# Patient Record
Sex: Male | Born: 1935 | Race: White | Hispanic: No | Marital: Married | Smoking: Never smoker
Health system: Southern US, Community
[De-identification: ages and names within clinical notes are randomized; demographics above are authoritative.]

## PROBLEM LIST (undated history)

## (undated) DIAGNOSIS — K449 Diaphragmatic hernia without obstruction or gangrene: Secondary | ICD-10-CM

## (undated) DIAGNOSIS — I499 Cardiac arrhythmia, unspecified: Secondary | ICD-10-CM

## (undated) DIAGNOSIS — F0390 Unspecified dementia without behavioral disturbance: Secondary | ICD-10-CM

## (undated) DIAGNOSIS — G459 Transient cerebral ischemic attack, unspecified: Secondary | ICD-10-CM

## (undated) DIAGNOSIS — R413 Other amnesia: Secondary | ICD-10-CM

## (undated) DIAGNOSIS — C787 Secondary malignant neoplasm of liver and intrahepatic bile duct: Principal | ICD-10-CM

## (undated) DIAGNOSIS — E7800 Pure hypercholesterolemia, unspecified: Secondary | ICD-10-CM

## (undated) DIAGNOSIS — K219 Gastro-esophageal reflux disease without esophagitis: Secondary | ICD-10-CM

## (undated) DIAGNOSIS — K5730 Diverticulosis of large intestine without perforation or abscess without bleeding: Secondary | ICD-10-CM

## (undated) HISTORY — PX: ABDOMINAL HERNIA REPAIR: SHX539

## (undated) HISTORY — DX: Transient cerebral ischemic attack, unspecified: G45.9

## (undated) HISTORY — DX: Unspecified dementia, unspecified severity, without behavioral disturbance, psychotic disturbance, mood disturbance, and anxiety: F03.90

## (undated) HISTORY — PX: CHOLECYSTECTOMY: SHX55

## (undated) HISTORY — DX: Other amnesia: R41.3

## (undated) HISTORY — DX: Diverticulosis of large intestine without perforation or abscess without bleeding: K57.30

## (undated) HISTORY — DX: Secondary malignant neoplasm of liver and intrahepatic bile duct: C78.7

## (undated) HISTORY — DX: Diaphragmatic hernia without obstruction or gangrene: K44.9

## (undated) HISTORY — DX: Gastro-esophageal reflux disease without esophagitis: K21.9

## (undated) HISTORY — DX: Pure hypercholesterolemia, unspecified: E78.00

---

## 1999-11-08 ENCOUNTER — Encounter (INDEPENDENT_AMBULATORY_CARE_PROVIDER_SITE_OTHER): Payer: Self-pay | Admitting: *Deleted

## 1999-11-08 ENCOUNTER — Ambulatory Visit (HOSPITAL_BASED_OUTPATIENT_CLINIC_OR_DEPARTMENT_OTHER): Admission: RE | Admit: 1999-11-08 | Discharge: 1999-11-08 | Payer: Self-pay | Admitting: *Deleted

## 1999-12-29 ENCOUNTER — Other Ambulatory Visit: Admission: RE | Admit: 1999-12-29 | Discharge: 1999-12-29 | Payer: Self-pay | Admitting: Urology

## 2002-04-30 DIAGNOSIS — K5730 Diverticulosis of large intestine without perforation or abscess without bleeding: Secondary | ICD-10-CM

## 2002-04-30 HISTORY — DX: Diverticulosis of large intestine without perforation or abscess without bleeding: K57.30

## 2002-08-19 ENCOUNTER — Encounter: Payer: Self-pay | Admitting: Internal Medicine

## 2002-08-19 ENCOUNTER — Encounter: Admission: RE | Admit: 2002-08-19 | Discharge: 2002-08-19 | Payer: Self-pay | Admitting: Internal Medicine

## 2002-10-31 DIAGNOSIS — K449 Diaphragmatic hernia without obstruction or gangrene: Secondary | ICD-10-CM

## 2002-10-31 DIAGNOSIS — K219 Gastro-esophageal reflux disease without esophagitis: Secondary | ICD-10-CM

## 2002-10-31 HISTORY — DX: Diaphragmatic hernia without obstruction or gangrene: K44.9

## 2002-10-31 HISTORY — DX: Gastro-esophageal reflux disease without esophagitis: K21.9

## 2004-10-11 ENCOUNTER — Ambulatory Visit: Payer: Self-pay | Admitting: Gastroenterology

## 2005-04-07 ENCOUNTER — Encounter: Admission: RE | Admit: 2005-04-07 | Discharge: 2005-04-07 | Payer: Self-pay | Admitting: Internal Medicine

## 2006-09-27 ENCOUNTER — Encounter: Admission: RE | Admit: 2006-09-27 | Discharge: 2006-09-27 | Payer: Self-pay | Admitting: Internal Medicine

## 2006-11-09 ENCOUNTER — Ambulatory Visit (HOSPITAL_COMMUNITY): Admission: RE | Admit: 2006-11-09 | Discharge: 2006-11-09 | Payer: Self-pay | Admitting: General Surgery

## 2010-10-25 NOTE — Op Note (Signed)
NAME:  Randall Francis, Randall Francis NO.:  [id number]   MEDICAL RECORD NO.:  [id number]          PATIENT TYPE:  AMB   LOCATION:  DAY                          FACILITY:  [facility]   PHYSICIAN:  Adolph Pollack, M.D.DATE OF BIRTH:  January 17, 1923   DATE OF PROCEDURE:  11/09/2006  DATE OF DISCHARGE:                               OPERATIVE REPORT   PROCEDURE:  Left inguinal hernia.   POSTOPERATIVE DIAGNOSIS:  Left inguinal hernia (indirect).   PROCEDURE:  Left inguinal hernia repair with mesh.   SURGEON:  Adolph Pollack, M.D.   ANESTHESIA:  General by way of LMA plus local (0.5% plain Marcaine).   INDICATIONS:  Randall Francis was a very active 77 year old male who had  noted a painful swelling a left inguinal area and described an episode  of transient incarceration and where he is actually able to lie down and  push the hernia back in when it came out and was painful.  He has a left  inguinal hernia on exam and now presents for repair.  We have discussed  the procedure risks and aftercare preoperatively.   TECHNIQUE:  He was seen in the holding area and the left groin marked  with my initials.  He was then brought to the operating room, placed  supine on the operating table, and given a general anesthetic by way of  LMA.  The hair in the left groin was clipped and the area was sterilely  prepped and draped.  Local anesthetic was infiltrated superficially and  deep in the left groin area.  A left groin incision was made through the  skin and subcutaneous tissue until the external oblique aponeurosis was  identified.  More local anesthetic was infiltrated deep to the external  oblique aponeurosis.  The external oblique aponeurosis was then divided  through the external ring medially up toward the anterior superior iliac  spine laterally.  Using blunt dissection, the shelving edge of the  inguinal ligament was exposed inferiorly and the internal oblique muscle  and aponeurosis  were exposed superiorly.  The ilioinguinal nerve was  identified and retracted inferiorly out of the field of dissection.   I then encircled the spermatic cord and isolated it doing posterior  dissection.  The indirect hernia sac was identified and was it carefully  dissected free from the spermatic cord contents and reduced back through  a patulous internal ring.   A piece of 3 x 6 inch polypropylene mesh was brought into the field and  anchored 1-2 cm medial to the pubic tubercle with 2-0 Prolene suture.  The inferior aspect of the mesh was anchored to the shelving edge of the  inguinal ligament with running 2-0 Prolene suture up to a level 1-2 cm  lateral to the internal ring.  A slit was cut in the mesh and the two  tails were wrapped around the cord.  The superior aspect of the mesh was  anchored to the internal oblique aponeurosis and muscle with interrupted  2-0 Vicryl sutures.  The two tails of the mesh were crossed creating a  new internal ring, and these were anchored  to the shelving edge of the  inguinal ligament with a single 2-0 Prolene suture.  The tip of a  hemostat was able to be placed through the new aperture.   Following this I inspected the area and hemostasis was adequate.  I  injected local anesthetic into the internal oblique aponeurosis.  I then  replaced the ilioinguinal nerve and closed the external oblique  aponeurosis over the mesh and  cord with a running 3-0 Vicryl suture.  Scarpa fascia was approximated  with a running 2-0 Vicryl suture.  The skin was closed with 4-0 Monocryl  subcuticular stitches followed by Steri-Strips and sterile dressings.  He tolerated the procedure without any apparent complications and was  taken to the recovery room in satisfactory condition.      Adolph Pollack, M.D.  Electronically Signed     TJR/MEDQ  D:  11/09/2006  T:  11/09/2006  Job:  [id number]   cc:   Loraine Leriche A. Perini, M.D.  Fax: [phone]

## 2012-08-30 ENCOUNTER — Other Ambulatory Visit: Payer: Self-pay | Admitting: Neurology

## 2012-10-23 ENCOUNTER — Encounter: Payer: Self-pay | Admitting: Nurse Practitioner

## 2012-10-23 ENCOUNTER — Ambulatory Visit (INDEPENDENT_AMBULATORY_CARE_PROVIDER_SITE_OTHER): Payer: Medicare Other | Admitting: Nurse Practitioner

## 2012-10-23 VITALS — BP 118/70 | HR 89 | Ht 72.5 in | Wt 180.0 lb

## 2012-10-23 DIAGNOSIS — R413 Other amnesia: Secondary | ICD-10-CM | POA: Insufficient documentation

## 2012-10-23 MED ORDER — RIVASTIGMINE 9.5 MG/24HR TD PT24: 1.0000 | MEDICATED_PATCH | Freq: Every day | TRANSDERMAL | Status: DC

## 2012-10-23 NOTE — Progress Notes (Signed)
HPI: The patient is a 79 y.o. year old male who has had memory issues for about six years.  The patient does his finances in the home but his wife  writes the checks. The patient  does  drive.  There have not been any motor vehicle accidents.  The patient does not cook. The patient is  able to perform  own ADL's.  The patient is able to distribute  own medications.  The patients bladder and bowel are  under good control.  There have been no behavioral changes since last seen.  There have been  no hallucinations. He is currently on Exelon patch without skin issues. He has had side effects to Namenda and Aricept in the past. He plays golf several times a week. Occasionally goes for walks. Weight is stable No new neurologic complaints   ROS:   weight loss, fatigue, memory loss, change in appetite   Physical Exam General: well developed, well nourished, seated, in no evident distress Head: head normocephalic and atraumatic. Oropharynx benign Neck: supple with no carotid or supraclavicular bruits Cardiovascular: regular rate and rhythm, no murmurs  Neurologic Exam Mental Status: Awake and fully alert. Oriented to place and time. MMSE 29/30 missing one recall item . AFT 12.  Mood and affect appropriate.  Cranial Nerves:Pupils equal, briskly reactive to light. Extraocular movements full without nystagmus. Visual fields full to confrontation. Hearing decreased bilaterally with hearing aids . Facial sensation intact. Face, tongue, palate move normally and symmetrically. Neck flexion and extension normal.  Motor: Normal bulk and tone. Normal strength in all tested extremity muscles. No focal weakness Sensory.: intact to touch and pinprick and decreased vibratory in both feet and ankles .  Coordination: Rapid alternating movements normal in all extremities. Finger-to-nose and heel-to-shin performed accurately bilaterally. Gait and Station: Arises from chair without difficulty. Stance is normal. . Able to heel,  toe and tandem walk without difficulty. No assistive device Reflexes: 1+ and symmetric. Toes downgoing.     ASSESSMENT: Mild memory loss with stable MMSE 29/30,  Missing one recall item. AFT 12.      PLAN: Patient to  continue Exelon patches, will refill Fall risk =7, be careful with ambulation Followup in one year, next visit with Dr. Leander Rams Darrol Angel, GNP-BC APRN

## 2012-10-23 NOTE — Patient Instructions (Addendum)
Patient to  continue Exelon patches Followup in one year, next visit with Dr. Vickey Huger

## 2013-01-14 ENCOUNTER — Ambulatory Visit: Payer: Medicare Other | Attending: Internal Medicine | Admitting: Physical Therapy

## 2013-01-14 DIAGNOSIS — IMO0001 Reserved for inherently not codable concepts without codable children: Secondary | ICD-10-CM | POA: Insufficient documentation

## 2013-01-14 DIAGNOSIS — R269 Unspecified abnormalities of gait and mobility: Secondary | ICD-10-CM | POA: Insufficient documentation

## 2013-01-14 DIAGNOSIS — R42 Dizziness and giddiness: Secondary | ICD-10-CM | POA: Insufficient documentation

## 2013-01-21 ENCOUNTER — Ambulatory Visit: Payer: Medicare Other | Admitting: Physical Therapy

## 2013-01-27 ENCOUNTER — Ambulatory Visit: Payer: Medicare Other | Admitting: Physical Therapy

## 2013-02-03 ENCOUNTER — Ambulatory Visit: Payer: Medicare Other | Admitting: Physical Therapy

## 2013-02-11 ENCOUNTER — Ambulatory Visit: Payer: Medicare Other | Attending: Internal Medicine | Admitting: Physical Therapy

## 2013-02-11 DIAGNOSIS — IMO0001 Reserved for inherently not codable concepts without codable children: Secondary | ICD-10-CM | POA: Insufficient documentation

## 2013-02-11 DIAGNOSIS — R42 Dizziness and giddiness: Secondary | ICD-10-CM | POA: Insufficient documentation

## 2013-02-11 DIAGNOSIS — R269 Unspecified abnormalities of gait and mobility: Secondary | ICD-10-CM | POA: Insufficient documentation

## 2013-04-30 ENCOUNTER — Ambulatory Visit (INDEPENDENT_AMBULATORY_CARE_PROVIDER_SITE_OTHER): Payer: Medicare Other | Admitting: Podiatrist

## 2013-04-30 ENCOUNTER — Encounter: Payer: Self-pay | Admitting: Podiatrist

## 2013-04-30 VITALS — BP 126/77 | HR 76 | Resp 12 | Ht 73.0 in | Wt 194.0 lb

## 2013-04-30 DIAGNOSIS — B351 Tinea unguium: Secondary | ICD-10-CM

## 2013-04-30 DIAGNOSIS — M79609 Pain in unspecified limb: Secondary | ICD-10-CM

## 2013-05-02 NOTE — Progress Notes (Signed)
HPI:  Patient presents today for follow up of foot and nail care. Denies any new complaints today.  Objective:  Patients chart is reviewed.  Neurovascular status unchanged.  Patients nails are thickened, discolored, distrophic, friable and brittle with yellow-brown discoloration. Patient subjectively relates they are painful with shoes and with ambulation of bilateral feet.  Assessment:  Symptomatic onychomycosis  Plan:  Discussed treatment options and alternatives.  The symptomatic toenails were debrided through manual an mechanical means without complication.  Return appointment recommended at routine intervals of 3 months    Midge Momon, DPM   

## 2013-05-30 ENCOUNTER — Other Ambulatory Visit: Payer: Self-pay | Admitting: Internal Medicine

## 2013-05-30 ENCOUNTER — Ambulatory Visit
Admission: RE | Admit: 2013-05-30 | Discharge: 2013-05-30 | Disposition: A | Discharge status: Home / Self Care | Payer: Medicare Other | Source: Ambulatory Visit | Attending: Internal Medicine | Admitting: Internal Medicine

## 2013-05-30 ENCOUNTER — Encounter: Payer: Self-pay | Admitting: Gastroenterology

## 2013-05-30 DIAGNOSIS — R634 Abnormal weight loss: Secondary | ICD-10-CM

## 2013-05-30 DIAGNOSIS — R945 Abnormal results of liver function studies: Secondary | ICD-10-CM

## 2013-05-30 MED ORDER — IOHEXOL 300 MG/ML  SOLN
30.0000 mL | Freq: Once | INTRAMUSCULAR | Status: AC | PRN
  Administered 2013-05-30: 30 mL via ORAL

## 2013-05-30 MED ORDER — IOHEXOL 300 MG/ML  SOLN
100.0000 mL | Freq: Once | INTRAMUSCULAR | Status: AC | PRN
  Administered 2013-05-30: 100 mL via INTRAVENOUS

## 2013-06-02 ENCOUNTER — Encounter: Payer: Self-pay | Admitting: *Deleted

## 2013-06-02 ENCOUNTER — Telehealth: Payer: Self-pay | Admitting: Oncology

## 2013-06-02 ENCOUNTER — Other Ambulatory Visit (HOSPITAL_COMMUNITY): Payer: Self-pay | Admitting: Internal Medicine

## 2013-06-02 DIAGNOSIS — K769 Liver disease, unspecified: Secondary | ICD-10-CM

## 2013-06-02 NOTE — Telephone Encounter (Signed)
S/W PT WIFE AND GVE NP APPT 12/24 @ 9:30 W/LISA Maisie Fus PER MD

## 2013-06-03 ENCOUNTER — Ambulatory Visit (INDEPENDENT_AMBULATORY_CARE_PROVIDER_SITE_OTHER): Payer: Medicare Other | Admitting: Gastroenterology

## 2013-06-03 ENCOUNTER — Encounter (HOSPITAL_COMMUNITY): Payer: Self-pay | Admitting: Pharmacy Technician

## 2013-06-03 ENCOUNTER — Other Ambulatory Visit: Payer: Self-pay | Admitting: Radiology

## 2013-06-03 ENCOUNTER — Encounter: Payer: Self-pay | Admitting: Gastroenterology

## 2013-06-03 VITALS — BP 84/60 | HR 72 | Ht 72.0 in | Wt 166.0 lb

## 2013-06-03 DIAGNOSIS — C799 Secondary malignant neoplasm of unspecified site: Secondary | ICD-10-CM

## 2013-06-03 DIAGNOSIS — R160 Hepatomegaly, not elsewhere classified: Secondary | ICD-10-CM

## 2013-06-03 DIAGNOSIS — R634 Abnormal weight loss: Secondary | ICD-10-CM

## 2013-06-03 DIAGNOSIS — C801 Malignant (primary) neoplasm, unspecified: Secondary | ICD-10-CM

## 2013-06-03 NOTE — Patient Instructions (Signed)
You will be contacted by [facility] regarding your Cologuard test.

## 2013-06-03 NOTE — Progress Notes (Signed)
History of Present Illness:  This is a 79 year old Caucasian male with newly diagnosed probable metastatic cancer to his liver per CT scan performed on 05/30/2013.  This shows diffuse hepatic metastases without primary neoplasm, also metastasis to S2 sacral segment, and some retroperitoneal lymph nodes.  There is a 1.4 x 2.3 cm right renal lesion which is been present for some time.  Patient's main complaint is fatigue and lack of appetite.  Otherwise he denies any general or specific GI complaint.  I did colonoscopy in 2003 and endoscopy exam which were unremarkable.  There is no evidence of colon polyps, Barrett's mucosa, or other lesions.  Patient has appointment with oncology later this week.  He has no history of alcohol abuse, cirrhosis, or any previous liver disease.  He is gradually losing weight, has advancing fatigue, but denies fever, chills, or other systemic complaints.  Labs show mild anemia with hematocrit of 36 and normal liver function tests except for slightly elevated bilirubin, he has a history of enlarged prostate is followed by Dr. Gaynelle Arabian.  Recent PSA was normal at 1.8 despite enlarged prostate on CT scan.  Serum creatinine was normal at 1.2.  Hematocrit was 36 with an elevated MCV of 102, certainly not consistent with iron deficiency.  His family history is negative for any known gastrointestinal cancer.  Previous CT scan was done in September of 2013 in April of 2014.  These exams did not show any specific abnormality other than a duodenal diverticulum, 0.9 cm left lung nodule, exophytic right kidney lesion, and enlarged prostate.  I have reviewed this patient's present history, medical and surgical past history, allergies and medications.     ROS:   All systems were reviewed and are negative unless otherwise stated in the HPI.  Allergies  Allergen Reactions  . Aricept [Donepezil Hcl]   . Statins    Outpatient Prescriptions Prior to Visit  Medication Sig Dispense Refill  .  b complex vitamins tablet Take 1 tablet by mouth daily.      . finasteride (PROSCAR) 5 MG tablet Take 5 mg by mouth daily.       . fluticasone (FLONASE) 50 MCG/ACT nasal spray       . omega-3 acid ethyl esters (LOVAZA) 1 G capsule Take 1 mg by mouth 2 (two) times daily.       . valsartan (DIOVAN) 160 MG tablet Take 160 mg by mouth daily.      Marland Kitchen zolpidem (AMBIEN) 10 MG tablet Take .25 tab a day      . telmisartan (MICARDIS) 40 MG tablet        No facility-administered medications prior to visit.   Past Medical History  Diagnosis Date  . High cholesterol   . Dementia   . Memory loss   . TIA (transient ischemic attack)   . Diverticulosis of colon (without mention of hemorrhage) 04-30-2002  . Hiatal hernia 10-31-2002  . GERD (gastroesophageal reflux disease) 10-31-2002   Past Surgical History  Procedure Laterality Date  . Abdominal hernia repair    . Cholecystectomy     History   Social History  . Marital Status: Married    Spouse Name: N/A    Number of Children: 4  . Years of Education: N/A   Occupational History  . retired    Social History Main Topics  . Smoking status: Never Smoker   . Smokeless tobacco: Never Used  . Alcohol Use: No  . Drug Use: No  . Sexual Activity:  None   Other Topics Concern  . None   Social History Narrative   Patient is married and has 4 children. Pt is retired and worked in [employer] for 50 plus years.    Family History  Problem Relation Age of Onset  . Cancer Mother     male cancer ? type  . Hypertension Father        Physical Exam: Blood pressure 84/60, pulse 72 and regular and weight 166 with a BMI of 22.51.  This patient has no obvious stigmata of chronic liver disease.  His abdomen shows an enlarged lobular liver in the right upper quadrant without other abdominal masses or evidence of ascites.  General physical exam otherwise was not performed.  Mental status is normal.    Assessment and plan: Randall Francis  unfortunately has metastatic cancer to his liver, primary source unclear.  I suspect he may have renal cell carcinoma which is metastatic to his liver and bones.  I have ordered a COLOGUARD stool exam which is a DNA test for colon cancer, and is 97% specific.  I do not think he would benefit from repeat endoscopy or colonoscopy at this time.  Current plans are to apparently to see oncology and undergo CT-guided liver biopsy with possible chemotherapy.  I concur with these plans.  Unfortunately we have very little last had this patient's care at this point

## 2013-06-04 ENCOUNTER — Ambulatory Visit: Payer: Medicare Other

## 2013-06-04 ENCOUNTER — Telehealth: Payer: Self-pay | Admitting: Oncology

## 2013-06-04 ENCOUNTER — Ambulatory Visit (HOSPITAL_BASED_OUTPATIENT_CLINIC_OR_DEPARTMENT_OTHER): Payer: Medicare Other | Admitting: Nurse Practitioner

## 2013-06-04 ENCOUNTER — Ambulatory Visit (HOSPITAL_COMMUNITY)
Admission: RE | Admit: 2013-06-04 | Discharge: 2013-06-04 | Disposition: A | Discharge status: Home / Self Care | Payer: Medicare Other | Source: Ambulatory Visit | Attending: Nurse Practitioner | Admitting: Nurse Practitioner

## 2013-06-04 VITALS — BP 109/69 | HR 71 | Temp 97.4°F | Resp 19 | Ht 72.0 in | Wt 165.5 lb

## 2013-06-04 DIAGNOSIS — R05 Cough: Secondary | ICD-10-CM | POA: Insufficient documentation

## 2013-06-04 DIAGNOSIS — R059 Cough, unspecified: Secondary | ICD-10-CM | POA: Insufficient documentation

## 2013-06-04 DIAGNOSIS — R630 Anorexia: Secondary | ICD-10-CM

## 2013-06-04 DIAGNOSIS — I2510 Atherosclerotic heart disease of native coronary artery without angina pectoris: Secondary | ICD-10-CM | POA: Insufficient documentation

## 2013-06-04 DIAGNOSIS — I959 Hypotension, unspecified: Secondary | ICD-10-CM

## 2013-06-04 DIAGNOSIS — M216X9 Other acquired deformities of unspecified foot: Secondary | ICD-10-CM

## 2013-06-04 DIAGNOSIS — C801 Malignant (primary) neoplasm, unspecified: Secondary | ICD-10-CM | POA: Insufficient documentation

## 2013-06-04 DIAGNOSIS — C787 Secondary malignant neoplasm of liver and intrahepatic bile duct: Secondary | ICD-10-CM

## 2013-06-04 DIAGNOSIS — R634 Abnormal weight loss: Secondary | ICD-10-CM

## 2013-06-04 DIAGNOSIS — I700 Atherosclerosis of aorta: Secondary | ICD-10-CM | POA: Insufficient documentation

## 2013-06-04 DIAGNOSIS — K7689 Other specified diseases of liver: Secondary | ICD-10-CM

## 2013-06-04 DIAGNOSIS — I517 Cardiomegaly: Secondary | ICD-10-CM | POA: Insufficient documentation

## 2013-06-04 NOTE — Telephone Encounter (Signed)
Gave pt appt for MD visit on 12/29th, pt sent to CT today , pno precert needed per [facility]

## 2013-06-04 NOTE — Progress Notes (Addendum)
OFFICE PROGRESS NOTE   Reason for Referral: Probable liver metastases on CT.   HPI: [name] is an 79 year old man referred to the cancer Center for evaluation of probable liver metastases identified on CT. He recently presented to his primary provider with anorexia, weight loss and fatigue. Labs showed abnormal liver function tests. He was referred for a CT scan of the abdomen and pelvis on 05/30/2013 with findings of diffuse hypodense lesions throughout the liver compatible with metastatic disease. An index lesion within the lower right liver measured 4 x 5 cm. There were a few enlarged gastrohepatic ligament, periportal and retroperitoneal lymph nodes with the index lymph node measuring 1.5 x 2.4 cm. There was a 1.4 x 2.3 cm right renal lesion, indeterminate. The pancreas, adrenal glands and spleen were unremarkable. Colonic diverticulosis noted with no other definite bowel abnormalities noted. A lytic lesion was noted within the posterior aspect of the S2 sacral segment.   He is scheduled for biopsy of a liver lesion on 06/06/2013.   He was seen by Dr. Jarold Motto, gastroenterology, on 06/03/2013. Repeat endoscopy or colonoscopy not felt to be of benefit at this time.     Past medical history: 1. Hypertension  2. Question history of TIA.  3. Memory issues.   Past surgical history: 1. Recent cataract surgery.  2. Remote cholecystectomy.  3. Left inguinal hernia repair with mesh 11/09/2006.  4. Status post removal of multiple "precancerous" skin lesions.   Current outpatient prescriptions:acetaminophen (TYLENOL) 325 MG tablet, Take 650 mg by mouth daily as needed for mild pain., Disp: , Rfl: ;  aspirin 81 MG tablet, Take 81 mg by mouth daily., Disp: , Rfl: ;  b complex vitamins tablet, Take 1 tablet by mouth daily., Disp: , Rfl: ;  Calcium Citrate-Vitamin D (CALCIUM CITRATE + D PO), Take 1 tablet by mouth 2 (two) times daily., Disp: , Rfl: ;  azithromycin (ZITHROMAX) 500 MG tablet, ,  Disp: , Rfl:  finasteride (PROSCAR) 5 MG tablet, Take 5 mg by mouth daily. , Disp: , Rfl: ;  fluticasone (FLONASE) 50 MCG/ACT nasal spray, , Disp: , Rfl: ;  folic acid (FOLVITE) 1 MG tablet, Take 1 mg by mouth 2 (two) times daily., Disp: , Rfl: ;  ibuprofen (ADVIL,MOTRIN) 200 MG tablet, Take 200 mg by mouth 2 (two) times daily as needed for mild pain., Disp: , Rfl:  omega-3 acid ethyl esters (LOVAZA) 1 G capsule, Take 1 mg by mouth 2 (two) times daily. , Disp: , Rfl: ;  OVER THE COUNTER MEDICATION, Take 1 tablet by mouth 2 (two) times daily. T2 Bone Health, Disp: , Rfl: ;  Simethicone (GAS-X PO), Take 1 tablet by mouth daily., Disp: , Rfl: ;  valsartan (DIOVAN) 160 MG tablet, Take 160 mg by mouth daily., Disp: , Rfl: ;  zolpidem (AMBIEN) 10 MG tablet, Take .25 tab a day, Disp: , Rfl: :  Allergies  Allergen Reactions  . Aricept [Donepezil Hcl]   . Statins   :  Family History: Mother deceased at age 69 with a gynecologic cancer; father deceased at age 40 of "old age". Sister deceased with "bladder or kidney cancer"; sister deceased in an airplane accident. One of his daughters has a history of aplastic anemia and MDS and underwent a bone marrow transplant.   Social History: He lives in [place]. He is married. He is retired. He previously worked as a [employer] manager. No alcohol or tobacco use. He has 4 children, 2 sons and 2 daughters.  Review of systems: 2-3 week history of significant anorexia with resultant weight loss. He is fatigued. No unusual headaches. No diplopia. He denies shortness of breath. He has been coughing for the past several days with phlegm expectorated. No chest pain. He has pain at the right upper quadrant with coughing. Recently he has had less frequent bowel movements. He attributes this to poor oral intake. No bloody or black stools. No nausea or vomiting. He denies dysphagia. No hematuria or dysuria. He notes that his urine is a dark yellow color. He estimates getting  up 3 times a night to urinate. No back pain.    Physcial Exam:  Blood pressure 78/54, pulse 90, temperature 97.4 F (36.3 C), temperature source Oral, resp. rate 19, height 6' (1.829 m), weight 165 lb 8 oz (75.07 kg).  General: Frail-appearing elderly male in no acute distress. HEENT: Pupils equal round and reactive to light. Extraocular movements intact. Mild scleral icterus. Oropharynx is without thrush or ulceration. Mucous membranes appear somewhat dry. Chest: Lungs are clear. No wheezes or rales. CV: Heart sounds distant. Regular cardiac rhythm. Abdomen: Abdomen soft and nontender. Liver is enlarged in the right abdomen to nearly the level of the umbilicus and crosses midline into the left upper quadrant. Extremities: No leg edema. Calves soft and nontender. Neuro: Alert and oriented. Motor strength intact except slight weakness with dorsiflexion at the right foot. Knee DTRs 1+, symmetric. Finger to nose intact. Hard of hearing. Skin: Decreased skin turgor. Areas of sun damaged noted over the face, ears.    Studies/Results:  Ct Abdomen Pelvis W Contrast  05/30/2013   CLINICAL DATA:  79 year old male with weight loss and abnormal LFTs.  EXAM: CT ABDOMEN AND PELVIS WITH CONTRAST  TECHNIQUE: Multidetector CT imaging of the abdomen and pelvis was performed using the standard protocol following bolus administration of intravenous contrast.  CONTRAST:  100 cc intravenous Omnipaque 300  COMPARISON:  None.  FINDINGS: Cardiomegaly is noted.  Diffuse hypodense lesions throughout the liver are noted compatible with metastatic disease. An index lesion within the lower right liver measures 4 x 5 cm (image 43).  There are a few enlarged gastrohepatic ligament periportal and retroperitoneal lymph nodes present, within the index lymph node measuring 1.5 x 2.4 cm (image 29).  Bilateral renal cortical atrophy noted. A 1.4 x 2.3 cm right renal lesion (84 Hounsfield units) is indeterminate.  The pancreas,  adrenal glands and spleen are unremarkable.  There is no evidence of biliary dilatation, abdominal aortic aneurysm or free fluid.  Colonic diverticulosis noted without evidence of diverticulitis. No other definite bowel abnormalities are noted.  A lytic lesion within the posterior aspect of the S2 sacral segment is noted.  Degenerative changes within the hips and lumbar spine noted.  IMPRESSION: Diffuse hepatic metastases without identifiable primary neoplasm. Probable metastasis within the S2 sacral segment and abdominal/retroperitoneal lymphatic spread.  1.4 x 2.3 cm right renal lesion - indeterminate. This may represent a metastatic lesion, primary neoplasm (unlikely to be the cause of the liver metastases) or complicated cyst.  These results were called by telephone at the time of interpretation on 05/30/2013 at 7:08 PM to Dr. Kevan Ny, who verbally acknowledged these results.   Electronically Signed   By: Laveda Abbe M.D.   On: 05/30/2013 19:09     Assessment and Plan:   1. CT abdomen/pelvis 05/30/2013 with diffuse hypodense lesions throughout the liver; a few enlarged gastrohepatic ligament, periportal and retroperitoneal lymph nodes; 1.4 x 2.3 cm right renal lesion,  indeterminate; lytic lesion within the posterior aspect of the S2 sacral segment. 2. Anorexia, weight loss, fatigue secondary to liver metastases. 3. Hypotension. Question dehydration. 4. Mild right foot drop. Question due to peroneal nerve compression related to weight loss.  Disposition-Mr. Docter appears to have metastatic cancer involving the liver. The primary site is unclear by history and evaluation to date. The right kidney lesion may be a cyst or primary renal cell carcinoma. However, the pattern of spread would be unusual for metastatic kidney cancer.  We are referring him for a noncontrast CT scan of the chest to complete the staging evaluation. He is scheduled for a liver biopsy on 06/06/2013.  He was hypotensive upon  arrival to the office. His blood pressure improved significantly with oral hydration while in the office. He will continue to push fluids at home.  He will return for a followup visit on 06/09/2013. He and his family understand to contact the office in the interim with any problems.    Patient seen with Dr. Truett Perna.    Lonna Cobb, ANP/GNP-BC  Mr. Zaremba was interviewed and examined. I reviewed the CT images and discussed the differential diagnosis with Mr. Bircher and his family. He appears to have extensive metastatic disease involving the liver. No clear  Primary tumor site based on review of his history, exam, and the abdomen/pelvic CT.  He will undergo a liver biopsy on 06/06/2013. He will return for an office visit on 06/09/2013.  Mancel Bale, M.D.

## 2013-06-05 ENCOUNTER — Other Ambulatory Visit: Payer: Self-pay | Admitting: Radiology

## 2013-06-06 ENCOUNTER — Ambulatory Visit (HOSPITAL_COMMUNITY)
Admission: RE | Admit: 2013-06-06 | Discharge: 2013-06-06 | Disposition: A | Discharge status: Home / Self Care | Payer: Medicare Other | Source: Ambulatory Visit | Attending: Internal Medicine | Admitting: Internal Medicine

## 2013-06-06 ENCOUNTER — Other Ambulatory Visit: Payer: Self-pay

## 2013-06-06 ENCOUNTER — Encounter (HOSPITAL_COMMUNITY): Payer: Self-pay

## 2013-06-06 ENCOUNTER — Encounter (HOSPITAL_COMMUNITY): Payer: Self-pay | Admitting: Emergency Medicine

## 2013-06-06 ENCOUNTER — Observation Stay (HOSPITAL_COMMUNITY)
Admission: EM | Admit: 2013-06-06 | Discharge: 2013-06-08 | Disposition: A | Discharge status: Home / Self Care | Payer: Medicare Other | Attending: Family Medicine | Admitting: Family Medicine

## 2013-06-06 DIAGNOSIS — I499 Cardiac arrhythmia, unspecified: Principal | ICD-10-CM | POA: Diagnosis present

## 2013-06-06 DIAGNOSIS — J209 Acute bronchitis, unspecified: Secondary | ICD-10-CM | POA: Diagnosis present

## 2013-06-06 DIAGNOSIS — I493 Ventricular premature depolarization: Secondary | ICD-10-CM

## 2013-06-06 DIAGNOSIS — R630 Anorexia: Secondary | ICD-10-CM | POA: Insufficient documentation

## 2013-06-06 DIAGNOSIS — K769 Liver disease, unspecified: Secondary | ICD-10-CM

## 2013-06-06 DIAGNOSIS — K219 Gastro-esophageal reflux disease without esophagitis: Secondary | ICD-10-CM | POA: Diagnosis present

## 2013-06-06 DIAGNOSIS — Z888 Allergy status to other drugs, medicaments and biological substances status: Secondary | ICD-10-CM | POA: Insufficient documentation

## 2013-06-06 DIAGNOSIS — F0390 Unspecified dementia without behavioral disturbance: Secondary | ICD-10-CM | POA: Insufficient documentation

## 2013-06-06 DIAGNOSIS — Z8673 Personal history of transient ischemic attack (TIA), and cerebral infarction without residual deficits: Secondary | ICD-10-CM | POA: Insufficient documentation

## 2013-06-06 DIAGNOSIS — Z79899 Other long term (current) drug therapy: Secondary | ICD-10-CM | POA: Insufficient documentation

## 2013-06-06 DIAGNOSIS — R7401 Elevation of levels of liver transaminase levels: Secondary | ICD-10-CM | POA: Diagnosis present

## 2013-06-06 DIAGNOSIS — E871 Hypo-osmolality and hyponatremia: Secondary | ICD-10-CM | POA: Diagnosis present

## 2013-06-06 DIAGNOSIS — E860 Dehydration: Secondary | ICD-10-CM | POA: Diagnosis present

## 2013-06-06 DIAGNOSIS — R413 Other amnesia: Secondary | ICD-10-CM | POA: Diagnosis present

## 2013-06-06 DIAGNOSIS — I4949 Other premature depolarization: Secondary | ICD-10-CM

## 2013-06-06 DIAGNOSIS — E43 Unspecified severe protein-calorie malnutrition: Secondary | ICD-10-CM | POA: Diagnosis present

## 2013-06-06 DIAGNOSIS — C787 Secondary malignant neoplasm of liver and intrahepatic bile duct: Secondary | ICD-10-CM | POA: Diagnosis present

## 2013-06-06 DIAGNOSIS — IMO0002 Reserved for concepts with insufficient information to code with codable children: Secondary | ICD-10-CM | POA: Insufficient documentation

## 2013-06-06 DIAGNOSIS — C801 Malignant (primary) neoplasm, unspecified: Secondary | ICD-10-CM

## 2013-06-06 DIAGNOSIS — E7800 Pure hypercholesterolemia, unspecified: Secondary | ICD-10-CM | POA: Insufficient documentation

## 2013-06-06 DIAGNOSIS — Z7982 Long term (current) use of aspirin: Secondary | ICD-10-CM | POA: Insufficient documentation

## 2013-06-06 HISTORY — DX: Cardiac arrhythmia, unspecified: I49.9

## 2013-06-06 LAB — CBC WITH DIFFERENTIAL/PLATELET
Basophils Absolute: 0 10*3/uL (ref 0.0–0.1)
Basophils Relative: 0 % (ref 0–1)
Eosinophils Absolute: 0 10*3/uL (ref 0.0–0.7)
HCT: 34.2 % — ABNORMAL LOW (ref 39.0–52.0)
MCH: 34.3 pg — ABNORMAL HIGH (ref 26.0–34.0)
MCHC: 36.5 g/dL — ABNORMAL HIGH (ref 30.0–36.0)
MCV: 94 fL (ref 78.0–100.0)
Neutrophils Relative %: 87 % — ABNORMAL HIGH (ref 43–77)
Platelets: 286 10*3/uL (ref 150–400)
RBC: 3.64 MIL/uL — ABNORMAL LOW (ref 4.22–5.81)
RDW: 14.7 % (ref 11.5–15.5)

## 2013-06-06 LAB — PROTIME-INR
INR: 1.16 (ref 0.00–1.49)
Prothrombin Time: 14.6 seconds (ref 11.6–15.2)

## 2013-06-06 LAB — COMPREHENSIVE METABOLIC PANEL
ALT: 148 U/L — ABNORMAL HIGH (ref 0–53)
AST: 268 U/L — ABNORMAL HIGH (ref 0–37)
Albumin: 2.8 g/dL — ABNORMAL LOW (ref 3.5–5.2)
CO2: 26 mEq/L (ref 19–32)
Chloride: 89 mEq/L — ABNORMAL LOW (ref 96–112)
Creatinine, Ser: 1.16 mg/dL (ref 0.50–1.35)
GFR calc non Af Amer: 54 mL/min — ABNORMAL LOW (ref >90)
Glucose, Bld: 86 mg/dL (ref 70–99)
Potassium: 4.8 mEq/L (ref 3.5–5.1)
Total Bilirubin: 5.5 mg/dL — ABNORMAL HIGH (ref 0.3–1.2)

## 2013-06-06 LAB — URINE MICROSCOPIC-ADD ON

## 2013-06-06 LAB — POCT I-STAT TROPONIN I: Troponin i, poc: 0.13 ng/mL (ref 0.00–0.08)

## 2013-06-06 LAB — TSH: TSH: 5.062 u[IU]/mL — ABNORMAL HIGH (ref 0.350–4.500)

## 2013-06-06 LAB — URINALYSIS, ROUTINE W REFLEX MICROSCOPIC
Nitrite: POSITIVE — AB
Protein, ur: 30 mg/dL — AB
Urobilinogen, UA: 2 mg/dL — ABNORMAL HIGH (ref 0.0–1.0)

## 2013-06-06 LAB — CBC
HCT: 35.8 % — ABNORMAL LOW (ref 39.0–52.0)
Hemoglobin: 13 g/dL (ref 13.0–17.0)
MCH: 34.1 pg — ABNORMAL HIGH (ref 26.0–34.0)
MCHC: 36.3 g/dL — ABNORMAL HIGH (ref 30.0–36.0)
MCV: 94 fL (ref 78.0–100.0)

## 2013-06-06 LAB — CK TOTAL AND CKMB (NOT AT ARMC)
CK, MB: 4.6 ng/mL — ABNORMAL HIGH (ref 0.3–4.0)
Total CK: 502 U/L — ABNORMAL HIGH (ref 7–232)

## 2013-06-06 LAB — MAGNESIUM: Magnesium: 2 mg/dL (ref 1.5–2.5)

## 2013-06-06 LAB — APTT: aPTT: 29 seconds (ref 24–37)

## 2013-06-06 MED ORDER — ONDANSETRON HCL 4 MG PO TABS: 4.0000 mg | ORAL_TABLET | Freq: Four times a day (QID) | ORAL | Status: DC | PRN

## 2013-06-06 MED ORDER — FOLIC ACID 1 MG PO TABS
1.0000 mg | ORAL_TABLET | Freq: Two times a day (BID) | ORAL | Status: DC
  Administered 2013-06-06 – 2013-06-08 (×4): 1 mg via ORAL
  Filled 2013-06-06 (×5): qty 1

## 2013-06-06 MED ORDER — SODIUM CHLORIDE 0.9 % IV SOLN: INTRAVENOUS | Status: DC

## 2013-06-06 MED ORDER — AZITHROMYCIN 250 MG PO TABS
250.0000 mg | ORAL_TABLET | Freq: Every day | ORAL | Status: DC
  Administered 2013-06-07 – 2013-06-08 (×2): 250 mg via ORAL
  Filled 2013-06-06 (×2): qty 1

## 2013-06-06 MED ORDER — FENTANYL CITRATE 0.05 MG/ML IJ SOLN
INTRAMUSCULAR | Status: DC
Start: 2013-06-06 — End: 2013-06-06
  Filled 2013-06-06: qty 2

## 2013-06-06 MED ORDER — SODIUM CHLORIDE 0.9 % IV SOLN
INTRAVENOUS | Status: AC
  Administered 2013-06-06: 17:00:00 via INTRAVENOUS

## 2013-06-06 MED ORDER — PANTOPRAZOLE SODIUM 40 MG PO TBEC
40.0000 mg | DELAYED_RELEASE_TABLET | Freq: Every day | ORAL | Status: DC
  Administered 2013-06-07 – 2013-06-08 (×2): 40 mg via ORAL
  Filled 2013-06-06 (×2): qty 1

## 2013-06-06 MED ORDER — MIDAZOLAM HCL 2 MG/2ML IJ SOLN
INTRAMUSCULAR | Status: AC
  Filled 2013-06-06: qty 2

## 2013-06-06 MED ORDER — ASPIRIN 81 MG PO CHEW
324.0000 mg | CHEWABLE_TABLET | Freq: Once | ORAL | Status: AC
  Administered 2013-06-06: 324 mg via ORAL
  Filled 2013-06-06: qty 4

## 2013-06-06 MED ORDER — HEPARIN SODIUM (PORCINE) 5000 UNIT/ML IJ SOLN
5000.0000 [IU] | Freq: Three times a day (TID) | INTRAMUSCULAR | Status: DC
  Administered 2013-06-06 – 2013-06-08 (×6): 5000 [IU] via SUBCUTANEOUS
  Filled 2013-06-06 (×8): qty 1

## 2013-06-06 MED ORDER — FLUTICASONE PROPIONATE 50 MCG/ACT NA SUSP
1.0000 | Freq: Every day | NASAL | Status: DC
  Filled 2013-06-06: qty 16

## 2013-06-06 MED ORDER — MORPHINE SULFATE 2 MG/ML IJ SOLN: 1.0000 mg | INTRAMUSCULAR | Status: DC | PRN

## 2013-06-06 MED ORDER — BUDESONIDE 0.25 MG/2ML IN SUSP
0.2500 mg | Freq: Two times a day (BID) | RESPIRATORY_TRACT | Status: DC
  Administered 2013-06-06 – 2013-06-08 (×4): 0.25 mg via RESPIRATORY_TRACT
  Filled 2013-06-06 (×6): qty 2

## 2013-06-06 MED ORDER — ENSURE COMPLETE PO LIQD
237.0000 mL | Freq: Three times a day (TID) | ORAL | Status: DC
  Administered 2013-06-06: 237 mL via ORAL

## 2013-06-06 MED ORDER — ACETAMINOPHEN 325 MG PO TABS: 650.0000 mg | ORAL_TABLET | Freq: Every day | ORAL | Status: DC | PRN

## 2013-06-06 MED ORDER — ASPIRIN 81 MG PO CHEW
81.0000 mg | CHEWABLE_TABLET | Freq: Every day | ORAL | Status: DC
  Administered 2013-06-06 – 2013-06-08 (×3): 81 mg via ORAL
  Filled 2013-06-06 (×3): qty 1

## 2013-06-06 MED ORDER — ONDANSETRON HCL 4 MG/2ML IJ SOLN: 4.0000 mg | Freq: Four times a day (QID) | INTRAMUSCULAR | Status: DC | PRN

## 2013-06-06 MED ORDER — FINASTERIDE 5 MG PO TABS
5.0000 mg | ORAL_TABLET | Freq: Every day | ORAL | Status: DC
  Administered 2013-06-06 – 2013-06-08 (×3): 5 mg via ORAL
  Filled 2013-06-06 (×3): qty 1

## 2013-06-06 MED ORDER — AZITHROMYCIN 500 MG PO TABS
500.0000 mg | ORAL_TABLET | Freq: Every day | ORAL | Status: AC
  Administered 2013-06-06: 500 mg via ORAL
  Filled 2013-06-06: qty 1

## 2013-06-06 MED ORDER — SODIUM CHLORIDE 0.9 % IJ SOLN: 3.0000 mL | Freq: Two times a day (BID) | INTRAMUSCULAR | Status: DC

## 2013-06-06 NOTE — ED Notes (Signed)
Procedure canceled d/t NSVT with hypotension. 5 to 6 beat PVCs runs; bp down to 80/60. Dr Bonnielee Haff spoke with Dr Waynard Edwards. Pt is to go to ED to be evaluated

## 2013-06-06 NOTE — Progress Notes (Signed)
Pt family expressing concern about when biopsy will perform and the desire to have it performed on this admission if he is determined to be stable.  Explained to pt and family that determination about pt stability for biopsy will not be determined until tomorrow after the cardiac enzymes and echo have resulted.  Pt's urgently asking if echo will be done first thing in the morning to which they were told that it could be done at any point in the day depending on where the pt was on the schedule.

## 2013-06-06 NOTE — H&P (Signed)
[facility] History and Physical  Randall Francis:[id number] DOB: March 25, 1923 DOA: 06/06/2013  Referring physician: Dr. Ethelda Chick  PCP: Ezequiel Kayser, MD   Chief Complaint: evated troponin, arrhythmia and hypotension  HPI: Randall Francis is a 79 y.o. male with PMH as mentioned below came to the hospital for liver biopsy procedure. Before procedure is performed patient developed self limiting tachycardia with subsequent transient hypotensive episode. NSVT mentioned as 5-6 bts of PVCs. BP 80/60. Strips not available. Pt sent to ER. His troponin was mildly elevated. Patient was found with dehydration, hyponatremic and hypochloremic. Also with mild elevation of troponin. Cardiology was consulted and [facility] called to admit for further evaluation and treatment. Patient reports anorexia, weight loss, cough (with phlegm); no fever, no chills. Denies  CP, nausea, vomiting, palpitations, hematemesis and melena.    Review of Systems:  Negative except as mentioned on HPI.  Past Medical History  Diagnosis Date  . High cholesterol   . Dementia   . Memory loss   . TIA (transient ischemic attack)   . Diverticulosis of colon (without mention of hemorrhage) 04-30-2002  . Hiatal hernia 10-31-2002  . GERD (gastroesophageal reflux disease) 10-31-2002  . Dysrhythmia    Past Surgical History  Procedure Laterality Date  . Abdominal hernia repair    . Cholecystectomy     Social History:  reports that he has never smoked. He has never used smokeless tobacco. He reports that he does not drink alcohol or use illicit drugs.  Allergies  Allergen Reactions  . Aricept [Donepezil Hcl]   . Statins     Family History  Problem Relation Age of Onset  . Cancer Mother     male cancer ? type  . Hypertension Father      Prior to Admission medications   Medication Sig Start Date End Date Taking? Authorizing Provider  acetaminophen (TYLENOL) 325 MG tablet Take 650 mg by mouth daily as  needed for mild pain.    Historical Provider, MD  aspirin 81 MG tablet Take 81 mg by mouth daily.    Historical Provider, MD  b complex vitamins tablet Take 1 tablet by mouth daily.    Historical Provider, MD  Calcium Citrate-Vitamin D (CALCIUM CITRATE + D PO) Take 1 tablet by mouth 2 (two) times daily.    Historical Provider, MD  finasteride (PROSCAR) 5 MG tablet Take 5 mg by mouth daily.  08/12/12   Historical Provider, MD  fluticasone (FLONASE) 50 MCG/ACT nasal spray Place 1 spray into both nostrils daily.  10/08/12   Historical Provider, MD  folic acid (FOLVITE) 1 MG tablet Take 1 mg by mouth 2 (two) times daily.    Historical Provider, MD  ibuprofen (ADVIL,MOTRIN) 200 MG tablet Take 200 mg by mouth 2 (two) times daily as needed for mild pain.    Historical Provider, MD  omega-3 acid ethyl esters (LOVAZA) 1 G capsule Take 1 mg by mouth 2 (two) times daily.  10/11/12   Historical Provider, MD  OVER THE COUNTER MEDICATION Take 1 tablet by mouth 2 (two) times daily. T2 Bone Health    Historical Provider, MD  Simethicone (GAS-X PO) Take 1 tablet by mouth daily.    Historical Provider, MD  valsartan (DIOVAN) 160 MG tablet Take 160 mg by mouth daily.    Historical Provider, MD  zolpidem (AMBIEN) 10 MG tablet Take .25 tab a day 10/14/12   Historical Provider, MD   Physical Exam: Filed Vitals:   06/06/13 1515  BP: 122/66  Pulse: 74  Resp: 28    BP 122/66  Pulse 74  Resp 28  SpO2 95%  Physical Exam:  Blood pressure 108/71, pulse 63, resp. rate 20, SpO2 94.00%.  General: Well developed, slender elderly, male in no acute distress  Head: Eyes PERRLA, No xanthomas. Normocephalic and atraumatic, oropharynx without edema or exudate. Dentition: Poor  Lungs: Clear to auscultation bilaterally  Heart: HRRR S1 S2, no rub/gallop, no murmur. pulses are 2+ all 4 extrem. Occasional premature beats.  Neck: No carotid bruits. No lymphadenopathy. JVD not elevated.  Abdomen: Bowel sounds present, abdomen soft and  non-tender without masses or hernias noted.  Msk: No spine or cva tenderness. No weakness, no joint deformities or effusions.  Extremities: No clubbing or cyanosis. No edema.  Neuro: Alert and oriented X 3. No focal deficits noted.  Psych: Good affect, responds appropriately  Skin: No rashes or lesions noted.          Labs on Admission:  Basic Metabolic Panel:  Recent Labs Lab 06/06/13 1204  NA 124*  K 4.8  CL 89*  CO2 26  GLUCOSE 86  BUN 22  CREATININE 1.16  CALCIUM 8.5   Liver Function Tests:  Recent Labs Lab 06/06/13 1204  AST 268*  ALT 148*  ALKPHOS 661*  BILITOT 5.5*  PROT 5.8*  ALBUMIN 2.8*   CBC:  Recent Labs Lab 06/06/13 0922 06/06/13 1241  WBC 12.8* 12.6*  NEUTROABS  --  11.0*  HGB 13.0 12.5*  HCT 35.8* 34.2*  MCV 94.0 94.0  PLT 305 286    Radiological Exams on Admission: No results found.  EKG:   06/06/2013  SR, PVCs, no acute ischemic changes; LVH noted  Vent. rate 71 BPM  PR interval 252 ms  QRS duration 109 ms  QT/QTc 442/480 ms  P-R-T axes 37 -58 134  Assessment/Plan 1-Cardiac arrhythmia and elevated troponin: patient denies CP and no acute ischemic changes on EKG. Per cardiology most likely related to dehydration. -will admit to telemetry -cycle CE'z -check TSH and check 2-D echo  2-  Liver metastasis and transaminitis: due to liver lesions. Unknown primary source. -Plan is for liver biopsy  -already seen by Hem/Onc -final treatment and further decisions to be determine base on diagnosis  3-dehydration and hyponatremia: patient has decrease appetite and intake. -will provide IVF's resuscitation -check orthostatic in am -hold antihypertensive regimen  4-severe protein calorie malnutrition: will start ensure TID  5-patient complaining of productive cough: recent CT suggesting bronchitis; patient also with mild WBC's elevation (even could be demargination from dehydration) -will start PRN nebulizer -start Z-pack -PRN  antitussive   6-GERD: will start PPI  [id number]  Cardiology (Dr. Elease Hashimoto)   Code Status: Full Family Communication: son at bedside Disposition Plan: LOS < 2 midnights, telemetry and observation  Time spent: 50 minutes  [name] [facility] Pager [phone]

## 2013-06-06 NOTE — H&P (Signed)
Chief Complaint: "I'm here for a liver biopsy" Referring Physician:Perini HPI: Randall Francis is an 79 y.o. male who is found to have multiple liver lesions concerning for metastatic disease. He is scheduled for biopsy today. PMHx and meds reviewed. Pt feels ok, no recent fever, illness, just weak.  Past Medical History:  Past Medical History  Diagnosis Date  . High cholesterol   . Dementia   . Memory loss   . TIA (transient ischemic attack)   . Diverticulosis of colon (without mention of hemorrhage) 04-30-2002  . Hiatal hernia 10-31-2002  . GERD (gastroesophageal reflux disease) 10-31-2002    Past Surgical History:  Past Surgical History  Procedure Laterality Date  . Abdominal hernia repair    . Cholecystectomy      Family History:  Family History  Problem Relation Age of Onset  . Cancer Mother     male cancer ? type  . Hypertension Father     Social History:  reports that he has never smoked. He has never used smokeless tobacco. He reports that he does not drink alcohol or use illicit drugs.  Allergies:  Allergies  Allergen Reactions  . Aricept [Donepezil Hcl]   . Statins     Medications:   Medication List    ASK your doctor about these medications       acetaminophen 325 MG tablet  Commonly known as:  TYLENOL  Take 650 mg by mouth daily as needed for mild pain.     aspirin 81 MG tablet  Take 81 mg by mouth daily.     azithromycin 500 MG tablet  Commonly known as:  ZITHROMAX     b complex vitamins tablet  Take 1 tablet by mouth daily.     CALCIUM CITRATE + D PO  Take 1 tablet by mouth 2 (two) times daily.     DIOVAN 160 MG tablet  Generic drug:  valsartan  Take 160 mg by mouth daily.     finasteride 5 MG tablet  Commonly known as:  PROSCAR  Take 5 mg by mouth daily.     fluticasone 50 MCG/ACT nasal spray  Commonly known as:  FLONASE     folic acid 1 MG tablet  Commonly known as:  FOLVITE  Take 1 mg by mouth 2 (two) times daily.      GAS-X PO  Take 1 tablet by mouth daily.     ibuprofen 200 MG tablet  Commonly known as:  ADVIL,MOTRIN  Take 200 mg by mouth 2 (two) times daily as needed for mild pain.     omega-3 acid ethyl esters 1 G capsule  Commonly known as:  LOVAZA  Take 1 mg by mouth 2 (two) times daily.     OVER THE COUNTER MEDICATION  Take 1 tablet by mouth 2 (two) times daily. T2 Bone Health     zolpidem 10 MG tablet  Commonly known as:  AMBIEN  Take .25 tab a day        Please HPI for pertinent positives, otherwise complete 10 system ROS negative.  Physical Exam: BP 117/70  Pulse 67  Temp(Src) 96.5 F (35.8 C) (Oral)  Resp 18  Ht 6' 1.5" (1.867 m)  Wt 165 lb (74.844 kg)  BMI 21.47 kg/m2  SpO2 100% Body mass index is 21.47 kg/(m^2).   General Appearance:  Alert, cooperative, no distress, appears stated age  Head:  Normocephalic, without obvious abnormality, atraumatic  ENT: Unremarkable  Neck: Supple, symmetrical, trachea midline  Lungs:  Clear to auscultation bilaterally, no w/r/r,  Chest Wall:  No tenderness or deformity  Heart:  Regular rate and rhythm, S1, S2 normal, no murmur, rub or gallop.  Abdomen:   Soft, non-tender, non distended.  Neurologic: Normal affect, no gross deficits.   No results found for this or any previous visit (from the past 48 hour(s)). Ct Chest Wo Contrast  06/04/2013   CLINICAL DATA:  Liver mets, cough, weight loss  EXAM: CT CHEST WITHOUT CONTRAST  TECHNIQUE: Multidetector CT imaging of the chest was performed following the standard protocol without IV contrast.  COMPARISON:  Correlation with CT abdomen pelvis dated 05/30/2013  FINDINGS: No suspicious pulmonary nodules. Very mild ground-glass opacity in the posterior right upper lobe (series 5/ image 31), likely infectious/inflammatory. Mild dependent atelectasis in the lower lobes with associated trace bilateral pleural effusions. Biapical pleural parenchymal scarring. No pneumothorax.  Visualized thyroid  is unremarkable.  Cardiomegaly. No pericardial effusion. Coronary atherosclerosis. Atherosclerotic calcifications of the aortic arch.  No suspicious mediastinal, hilar, or axillary lymphadenopathy.  Visualized upper abdomen is notable for extensive hepatic metastases, better visualized on recent CT abdomen/pelvis.  Mild degenerative changes of the visualized thoracolumbar spine.  IMPRESSION: No findings suspicious for primary malignancy in the chest.  Very mild ground-glass opacity in the posterior right upper lobe, likely infectious/inflammatory.  Extensive hepatic metastases, better visualized on recent CT abdomen/pelvis.   Electronically Signed   By: Charline Bills M.D.   On: 06/04/2013 15:20    Assessment/Plan Liver lesions For US guided biopsy Explained procedure to pt and family, including risks, complications, use of sedation. Labs pending Consent signed in chart  Brayton El PA-C 06/06/2013, 9:43 AM

## 2013-06-06 NOTE — ED Notes (Addendum)
Pt reportsd to the ED from interventional radiology for a liver biopsy. Prior to the procedure pt was having 5-6 beat runs of non-sustained V-tach. Pt also developed some hypotension prior to the procedure. BP was 80/60. Pt was asymptomatic through the episode and was resting when the dysrhythmia occurred. The hypotension correlated with the run of V-tach. Pt denies any CP or SOB. Pt blood pressure 122/73 and pt NSR at this time. Skin pale and dry. Resp e/u. Pt A&O x4,.

## 2013-06-06 NOTE — Consult Note (Signed)
CARDIOLOGY CONSULT NOTE   Patient ID: Randall Francis MRN: [id number] DOB/AGE: [birth year] 77 y.o.  Admit date: 06/06/2013  Primary Physician   Randall Kayser, MD Primary Cardiologist   New Reason for Consultation   Elevated troponin  [id number] D Batch is a 79 y.o. male with no history of CAD.  He came to the hospital for a liver biopsy today. Before it could be performed, he developed self-limiting tachycardia, NSVT mentioned as 5-6 bts of PVCs. BP 80/60. Strips not available. Pt sent to ER. His troponin was mildly elevated and cardiology was asked to evaluate him.   Randall Francis never gets chest pain. He has lost approximately 30 pounds over the last 6 months. He has noted increasing weakness and possibly some dyspnea on exertion. He also notes poor appetite and states his by mouth intake has been generally poor recently. His family agrees and states they have had trouble getting him to eat at all.  Today, Randall Francis did not feel any palpitations and did not feel anything when he was having PVCs on the monitor. He has not had chest pain today and there was no acute symptomatology during the episode. He did feel very weak today but that is not new and he is not able to tell me if it was a lot worse today. Currently he is resting comfortably.  Past Medical History  Diagnosis Date  . High cholesterol   . Dementia   . Memory loss   . TIA (transient ischemic attack)   . Diverticulosis of colon (without mention of hemorrhage) 04-30-2002  . Hiatal hernia 10-31-2002  . GERD (gastroesophageal reflux disease) 10-31-2002     Past Surgical History  Procedure Laterality Date  . Abdominal hernia repair    . Cholecystectomy      Allergies  Allergen Reactions  . Aricept [Donepezil Hcl]   . Statins     I have reviewed the patient's current medications Prior to Admission medications   Medication Sig Start Date End Date Taking? Authorizing Provider  acetaminophen  (TYLENOL) 325 MG tablet Take 650 mg by mouth daily as needed for mild pain.    Historical Provider, MD  aspirin 81 MG tablet Take 81 mg by mouth daily.    Historical Provider, MD  b complex vitamins tablet Take 1 tablet by mouth daily.    Historical Provider, MD  Calcium Citrate-Vitamin D (CALCIUM CITRATE + D PO) Take 1 tablet by mouth 2 (two) times daily.    Historical Provider, MD  finasteride (PROSCAR) 5 MG tablet Take 5 mg by mouth daily.  08/12/12   Historical Provider, MD  fluticasone (FLONASE) 50 MCG/ACT nasal spray Place 1 spray into both nostrils daily.  10/08/12   Historical Provider, MD  folic acid (FOLVITE) 1 MG tablet Take 1 mg by mouth 2 (two) times daily.    Historical Provider, MD  ibuprofen (ADVIL,MOTRIN) 200 MG tablet Take 200 mg by mouth 2 (two) times daily as needed for mild pain.    Historical Provider, MD  omega-3 acid ethyl esters (LOVAZA) 1 G capsule Take 1 mg by mouth 2 (two) times daily.  10/11/12   Historical Provider, MD  OVER THE COUNTER MEDICATION Take 1 tablet by mouth 2 (two) times daily. T2 Bone Health    Historical Provider, MD  Simethicone (GAS-X PO) Take 1 tablet by mouth daily.    Historical Provider, MD  valsartan (DIOVAN) 160 MG tablet Take 160 mg by mouth daily.  Historical Provider, MD  zolpidem (AMBIEN) 10 MG tablet Take .25 tab a day 10/14/12   Historical Provider, MD     History   Social History  . Marital Status: Married    Spouse Name: N/A    Number of Children: 4  . Years of Education: N/A   Occupational History  . retired    Social History Main Topics  . Smoking status: Never Smoker   . Smokeless tobacco: Never Used  . Alcohol Use: No  . Drug Use: No  . Sexual Activity: Not on file   Other Topics Concern  . Not on file   Social History Narrative   Patient is married and has 4 children. Pt is retired and worked in [employer] for 50 plus years.     Family Status  Relation Status Death Age  . Mother Deceased   . Father Deceased     Family History  Problem Relation Age of Onset  . Cancer Mother     male cancer ? type  . Hypertension Father      ROS:  Full 14 point review of systems complete and found to be negative unless listed above.  Physical Exam: Blood pressure 108/71, pulse 63, resp. rate 20, SpO2 94.00%.  General: Well developed, slender elderly, male in no acute distress Head: Eyes PERRLA, No xanthomas.   Normocephalic and atraumatic, oropharynx without edema or exudate. Dentition: Poor Lungs: Clear to auscultation bilaterally Heart: HRRR S1 S2, no rub/gallop, no murmur. pulses are 2+ all 4 extrem.  Occasional premature beats.  Neck: No carotid bruits. No lymphadenopathy.  JVD not elevated. Abdomen: Bowel sounds present, abdomen soft and non-tender without masses or hernias noted. Msk:  No spine or cva tenderness. No weakness, no joint deformities or effusions. Extremities: No clubbing or cyanosis. No edema.  Neuro: Alert and oriented X 3. No focal deficits noted. Psych:  Good affect, responds appropriately Skin: No rashes or lesions noted.  Labs:   Lab Results  Component Value Date   WBC 12.6* 06/06/2013   HGB 12.5* 06/06/2013   HCT 34.2* 06/06/2013   MCV 94.0 06/06/2013   PLT 286 06/06/2013    Recent Labs  06/06/13 0922  INR 1.16    Recent Labs Lab 06/06/13 1204  NA 124*  K 4.8  CL 89*  CO2 26  BUN 22  CREATININE 1.16  CALCIUM 8.5  PROT 5.8*  BILITOT 5.5*  ALKPHOS 661*  ALT 148*  AST 268*  GLUCOSE 86  ALBUMIN 2.8*    Recent Labs  06/06/13 1215  TROPIPOC 0.13*   ECG: 06/06/2013  SR, PVCs, no acute ischemic changes; LVH noted Vent. rate 71 BPM PR interval 252 ms QRS duration 109 ms QT/QTc 442/480 ms P-R-T axes 37 -58 134  Radiology:  Ct Chest Wo Contrast 06/04/2013   CLINICAL DATA:  Liver mets, cough, weight loss  EXAM: CT CHEST WITHOUT CONTRAST  TECHNIQUE: Multidetector CT imaging of the chest was performed following the standard protocol without IV contrast.   COMPARISON:  Correlation with CT abdomen pelvis dated 05/30/2013  FINDINGS: No suspicious pulmonary nodules. Very mild ground-glass opacity in the posterior right upper lobe (series 5/ image 31), likely infectious/inflammatory. Mild dependent atelectasis in the lower lobes with associated trace bilateral pleural effusions. Biapical pleural parenchymal scarring. No pneumothorax.  Visualized thyroid is unremarkable.  Cardiomegaly. No pericardial effusion. Coronary atherosclerosis. Atherosclerotic calcifications of the aortic arch.  No suspicious mediastinal, hilar, or axillary lymphadenopathy.  Visualized upper abdomen is  notable for extensive hepatic metastases, better visualized on recent CT abdomen/pelvis.  Mild degenerative changes of the visualized thoracolumbar spine.  IMPRESSION: No findings suspicious for primary malignancy in the chest.  Very mild ground-glass opacity in the posterior right upper lobe, likely infectious/inflammatory.  Extensive hepatic metastases, better visualized on recent CT abdomen/pelvis.   Electronically Signed   By: Charline Bills M.D.   On: 06/04/2013 15:20    ASSESSMENT AND PLAN:   The patient was seen today by Dr. Elease Hashimoto, the patient evaluated and the data reviewed.   Active Problems: Elevated troponin: We will check a regular troponin and continue to cycle cardiac enzymes. We'll check a CK-MB as well. He has frequent PVCs and nonsustained VT is reported so we'll check a TSH and a magnesium. An echocardiogram will be ordered. Further evaluation and treatment depending on the results, but no indication at this time for aggressive workup as he is asymptomatic.  Hypotension: His blood pressure has improved. It is entirely possible that he was dehydrated. Will leave management to primary care but will order orthostatic vital signs daily.   SignedTheodore Demark, PA-C 06/06/2013 2:07 PM Beeper [phone]  Co-Sign MD  Attending Note:   The patient was seen and  examined.  Agree with assessment and plan as noted above.  Changes made to the above note as needed.  Pt has markedly abnormal liver enzymes , probable liver mets on CT scan, poor appetitie, 30 lb weight loss.  He presented for a CT guided liver bx today and He was noted to have PVCs on the monitor. The bx was cancelled and he was sent to the ER.    He is completely asymptomatic.  He has no CP or dyspnea.  He has had progressive fatigue for the past several weeks - corresponding to his poor appetite and weight loss  His daughter has noticed a definite decline over the past week or so that she has been home visiting her parent.   He has benign PVCs on tele.    The POC Troponin is not significant and should be verified by regular lab troponin.  I would not proceed with any invasive procedure or with a myoview.  An echo may be helpful to determine his LV function ( which may be useful to know).  I do think an overnight obs admission for rehydration would likely make him feel better - although I doubt it will change his prognosis.   He needs to be rescheduled for a liver bx as soon as possible.    Will review the echo when available.  No additional cardiac evaluation needed at this time.    Vesta Mixer, Montez Hageman., MD, [facility] 06/06/2013, 3:21 PM

## 2013-06-06 NOTE — ED Notes (Signed)
NOTIFIED DR. Ethelda Chick IN PERSON OF PATIENTS LAB RESULTS OF I-STAT TROPONIN = 0.13 ng/ml ,@12 :38 pm ,06/06/2013.

## 2013-06-06 NOTE — ED Provider Notes (Signed)
CSN: [id number]     Arrival date & time 06/06/13  1130 History   First MD Initiated Contact with Patient 06/06/13 1219     Chief Complaint  Patient presents with  . Hypotension  . Irregular Heart Beat   is obtained from Dr. Bonnielee Haff, and with poor liver biopsy on patient this morning. (Consider location/radiation/quality/duration/timing/severity/associated sxs/prior Treatment) HPI patient was to have outpatient liver biopsy performed today for suspected metastatic disease. He developed hypotension with with blood pressure in the 80s and several runs of nonsustained ventricular tachycardia. Patient presently complains of generalized weakness. No other complaint. He denies chest pain denies shortness of breath. Admits to diminished appetite for the past several weeks. He was sent here for further evaluation. No treatment prior to coming here. Past Medical History  Diagnosis Date  . High cholesterol   . Dementia   . Memory loss   . TIA (transient ischemic attack)   . Diverticulosis of colon (without mention of hemorrhage) 04-30-2002  . Hiatal hernia 10-31-2002  . GERD (gastroesophageal reflux disease) 10-31-2002   Past Surgical History  Procedure Laterality Date  . Abdominal hernia repair    . Cholecystectomy     Family History  Problem Relation Age of Onset  . Cancer Mother     male cancer ? type  . Hypertension Father    History  Substance Use Topics  . Smoking status: Never Smoker   . Smokeless tobacco: Never Used  . Alcohol Use: No    Review of Systems  Constitutional: Positive for appetite change.  Neurological: Positive for weakness.    Allergies  Aricept and Statins  Home Medications   Current Outpatient Rx  Name  Route  Sig  Dispense  Refill  . acetaminophen (TYLENOL) 325 MG tablet   Oral   Take 650 mg by mouth daily as needed for mild pain.         Marland Kitchen aspirin 81 MG tablet   Oral   Take 81 mg by mouth daily.         Marland Kitchen b complex vitamins tablet    Oral   Take 1 tablet by mouth daily.         . Calcium Citrate-Vitamin D (CALCIUM CITRATE + D PO)   Oral   Take 1 tablet by mouth 2 (two) times daily.         . finasteride (PROSCAR) 5 MG tablet   Oral   Take 5 mg by mouth daily.          . fluticasone (FLONASE) 50 MCG/ACT nasal spray   Each Nare   Place 1 spray into both nostrils daily.          . folic acid (FOLVITE) 1 MG tablet   Oral   Take 1 mg by mouth 2 (two) times daily.         Marland Kitchen ibuprofen (ADVIL,MOTRIN) 200 MG tablet   Oral   Take 200 mg by mouth 2 (two) times daily as needed for mild pain.         Marland Kitchen omega-3 acid ethyl esters (LOVAZA) 1 G capsule   Oral   Take 1 mg by mouth 2 (two) times daily.          Marland Kitchen OVER THE COUNTER MEDICATION   Oral   Take 1 tablet by mouth 2 (two) times daily. T2 Bone Health         . Simethicone (GAS-X PO)   Oral   Take 1 tablet by  mouth daily.         . valsartan (DIOVAN) 160 MG tablet   Oral   Take 160 mg by mouth daily.         Marland Kitchen zolpidem (AMBIEN) 10 MG tablet      Take .25 tab a day          BP 127/76  Pulse 70  Resp 15  SpO2 98% Physical Exam  Nursing note and vitals reviewed. Constitutional:  Cachectic chronically ill-appearing  HENT:  Head: Normocephalic and atraumatic.  Eyes: Conjunctivae are normal. Pupils are equal, round, and reactive to light.  Neck: Neck supple. No tracheal deviation present. No thyromegaly present.  Cardiovascular: Normal rate.   No murmur heard. Irregular  Pulmonary/Chest: Effort normal and breath sounds normal.  Abdominal: Soft. Bowel sounds are normal. He exhibits no distension. There is no tenderness.  Musculoskeletal: Normal range of motion. He exhibits no edema and no tenderness.  Neurological: He is alert. Coordination normal.  Skin: Skin is warm and dry. No rash noted.  Psychiatric: He has a normal mood and affect.    ED Course  Procedures (including critical care time) Labs Review Labs Reviewed   COMPREHENSIVE METABOLIC PANEL - Abnormal; Notable for the following:    Sodium 124 (*)    Chloride 89 (*)    Total Protein 5.8 (*)    Albumin 2.8 (*)    AST 268 (*)    ALT 148 (*)    Alkaline Phosphatase 661 (*)    Total Bilirubin 5.5 (*)    GFR calc non Af Amer 54 (*)    GFR calc Af Amer 63 (*)    All other components within normal limits  POCT I-STAT TROPONIN I - Abnormal; Notable for the following:    Troponin i, poc 0.13 (*)    All other components within normal limits  CBC WITH DIFFERENTIAL  URINALYSIS, ROUTINE W REFLEX MICROSCOPIC   Imaging Review Ct Chest Wo Contrast  06/04/2013   CLINICAL DATA:  Liver mets, cough, weight loss  EXAM: CT CHEST WITHOUT CONTRAST  TECHNIQUE: Multidetector CT imaging of the chest was performed following the standard protocol without IV contrast.  COMPARISON:  Correlation with CT abdomen pelvis dated 05/30/2013  FINDINGS: No suspicious pulmonary nodules. Very mild ground-glass opacity in the posterior right upper lobe (series 5/ image 31), likely infectious/inflammatory. Mild dependent atelectasis in the lower lobes with associated trace bilateral pleural effusions. Biapical pleural parenchymal scarring. No pneumothorax.  Visualized thyroid is unremarkable.  Cardiomegaly. No pericardial effusion. Coronary atherosclerosis. Atherosclerotic calcifications of the aortic arch.  No suspicious mediastinal, hilar, or axillary lymphadenopathy.  Visualized upper abdomen is notable for extensive hepatic metastases, better visualized on recent CT abdomen/pelvis.  Mild degenerative changes of the visualized thoracolumbar spine.  IMPRESSION: No findings suspicious for primary malignancy in the chest.  Very mild ground-glass opacity in the posterior right upper lobe, likely infectious/inflammatory.  Extensive hepatic metastases, better visualized on recent CT abdomen/pelvis.   Electronically Signed   By: Charline Bills M.D.   On: 06/04/2013 15:20    EKG Interpretation    None      Date: 06/06/2013  Rate: 70  Rhythm: normal sinus rhythm  QRS Axis: left  Intervals: normal  ST/T Wave abnormalities: nonspecific ST changes  Conduction Disutrbances:none  Narrative Interpretation: PVCs present  Old EKG Reviewed: PVCs new from 11/08/2006 otherwise no significant change interpreted by me Results for orders placed during the hospital encounter of 06/06/13  COMPREHENSIVE METABOLIC PANEL  Result Value Range   Sodium 124 (*) 135 - 145 mEq/L   Potassium 4.8  3.5 - 5.1 mEq/L   Chloride 89 (*) 96 - 112 mEq/L   CO2 26  19 - 32 mEq/L   Glucose, Bld 86  70 - 99 mg/dL   BUN 22  6 - 23 mg/dL   Creatinine, Ser 9.14  0.50 - 1.35 mg/dL   Calcium 8.5  8.4 - 78.2 mg/dL   Total Protein 5.8 (*) 6.0 - 8.3 g/dL   Albumin 2.8 (*) 3.5 - 5.2 g/dL   AST 956 (*) 0 - 37 U/L   ALT 148 (*) 0 - 53 U/L   Alkaline Phosphatase 661 (*) 39 - 117 U/L   Total Bilirubin 5.5 (*) 0.3 - 1.2 mg/dL   GFR calc non Af Amer 54 (*) >90 mL/min   GFR calc Af Amer 63 (*) >90 mL/min  CBC WITH DIFFERENTIAL      Result Value Range   WBC 12.6 (*) 4.0 - 10.5 K/uL   RBC 3.64 (*) 4.22 - 5.81 MIL/uL   Hemoglobin 12.5 (*) 13.0 - 17.0 g/dL   HCT 21.3 (*) 08.6 - 57.8 %   MCV 94.0  78.0 - 100.0 fL   MCH 34.3 (*) 26.0 - 34.0 pg   MCHC 36.5 (*) 30.0 - 36.0 g/dL   RDW 46.9  62.9 - 52.8 %   Platelets 286  150 - 400 K/uL   Neutrophils Relative % 87 (*) 43 - 77 %   Neutro Abs 11.0 (*) 1.7 - 7.7 K/uL   Lymphocytes Relative 8 (*) 12 - 46 %   Lymphs Abs 1.0  0.7 - 4.0 K/uL   Monocytes Relative 5  3 - 12 %   Monocytes Absolute 0.7  0.1 - 1.0 K/uL   Eosinophils Relative 0  0 - 5 %   Eosinophils Absolute 0.0  0.0 - 0.7 K/uL   Basophils Relative 0  0 - 1 %   Basophils Absolute 0.0  0.0 - 0.1 K/uL  POCT I-STAT TROPONIN I      Result Value Range   Troponin i, poc 0.13 (*) 0.00 - 0.08 ng/mL   Comment NOTIFIED PHYSICIAN     Comment 3            Ct Chest Wo Contrast  06/04/2013   CLINICAL DATA:  Liver  mets, cough, weight loss  EXAM: CT CHEST WITHOUT CONTRAST  TECHNIQUE: Multidetector CT imaging of the chest was performed following the standard protocol without IV contrast.  COMPARISON:  Correlation with CT abdomen pelvis dated 05/30/2013  FINDINGS: No suspicious pulmonary nodules. Very mild ground-glass opacity in the posterior right upper lobe (series 5/ image 31), likely infectious/inflammatory. Mild dependent atelectasis in the lower lobes with associated trace bilateral pleural effusions. Biapical pleural parenchymal scarring. No pneumothorax.  Visualized thyroid is unremarkable.  Cardiomegaly. No pericardial effusion. Coronary atherosclerosis. Atherosclerotic calcifications of the aortic arch.  No suspicious mediastinal, hilar, or axillary lymphadenopathy.  Visualized upper abdomen is notable for extensive hepatic metastases, better visualized on recent CT abdomen/pelvis.  Mild degenerative changes of the visualized thoracolumbar spine.  IMPRESSION: No findings suspicious for primary malignancy in the chest.  Very mild ground-glass opacity in the posterior right upper lobe, likely infectious/inflammatory.  Extensive hepatic metastases, better visualized on recent CT abdomen/pelvis.   Electronically Signed   By: Charline Bills M.D.   On: 06/04/2013 15:20   Ct Abdomen Pelvis W Contrast  05/30/2013   CLINICAL DATA:  79 year old male with weight loss and abnormal LFTs.  EXAM: CT ABDOMEN AND PELVIS WITH CONTRAST  TECHNIQUE: Multidetector CT imaging of the abdomen and pelvis was performed using the standard protocol following bolus administration of intravenous contrast.  CONTRAST:  100 cc intravenous Omnipaque 300  COMPARISON:  None.  FINDINGS: Cardiomegaly is noted.  Diffuse hypodense lesions throughout the liver are noted compatible with metastatic disease. An index lesion within the lower right liver measures 4 x 5 cm (image 43).  There are a few enlarged gastrohepatic ligament periportal and  retroperitoneal lymph nodes present, within the index lymph node measuring 1.5 x 2.4 cm (image 29).  Bilateral renal cortical atrophy noted. A 1.4 x 2.3 cm right renal lesion (84 Hounsfield units) is indeterminate.  The pancreas, adrenal glands and spleen are unremarkable.  There is no evidence of biliary dilatation, abdominal aortic aneurysm or free fluid.  Colonic diverticulosis noted without evidence of diverticulitis. No other definite bowel abnormalities are noted.  A lytic lesion within the posterior aspect of the S2 sacral segment is noted.  Degenerative changes within the hips and lumbar spine noted.  IMPRESSION: Diffuse hepatic metastases without identifiable primary neoplasm. Probable metastasis within the S2 sacral segment and abdominal/retroperitoneal lymphatic spread.  1.4 x 2.3 cm right renal lesion - indeterminate. This may represent a metastatic lesion, primary neoplasm (unlikely to be the cause of the liver metastases) or complicated cyst.  These results were called by telephone at the time of interpretation on 05/30/2013 at 7:08 PM to Dr. Kevan Ny, who verbally acknowledged these results.   Electronically Signed   By: Laveda Abbe M.D.   On: 05/30/2013 19:09    MDM  No diagnosis found. Millard etiology consultation obtained. Will see patient in hospital. Aspirin ordered. Cardiology defers to hospitalist service for admission. Spoke with Dr.Madera And 23 hour observation, telemetry. Intravenous fluids Diagnosis #1 cardiac dysrhythmia #2 hyponatremia #3 elevated liver function tests #4 hyperbilirubinemia   Doug Sou, MD 06/06/13 1418

## 2013-06-07 DIAGNOSIS — E860 Dehydration: Secondary | ICD-10-CM

## 2013-06-07 DIAGNOSIS — I517 Cardiomegaly: Secondary | ICD-10-CM

## 2013-06-07 LAB — BASIC METABOLIC PANEL
BUN: 19 mg/dL (ref 6–23)
BUN: 20 mg/dL (ref 6–23)
BUN: 21 mg/dL (ref 6–23)
Calcium: 7.8 mg/dL — ABNORMAL LOW (ref 8.4–10.5)
Calcium: 7.8 mg/dL — ABNORMAL LOW (ref 8.4–10.5)
Calcium: 7.8 mg/dL — ABNORMAL LOW (ref 8.4–10.5)
Chloride: 91 mEq/L — ABNORMAL LOW (ref 96–112)
Creatinine, Ser: 1.08 mg/dL (ref 0.50–1.35)
GFR calc Af Amer: 69 mL/min — ABNORMAL LOW (ref >90)
GFR calc Af Amer: 72 mL/min — ABNORMAL LOW (ref >90)
GFR calc Af Amer: 64 mL/min — ABNORMAL LOW (ref >90)
GFR calc non Af Amer: 59 mL/min — ABNORMAL LOW (ref >90)
GFR calc non Af Amer: 62 mL/min — ABNORMAL LOW (ref >90)
GFR calc non Af Amer: 55 mL/min — ABNORMAL LOW (ref >90)
Glucose, Bld: 91 mg/dL (ref 70–99)
Glucose, Bld: 130 mg/dL — ABNORMAL HIGH (ref 70–99)
Potassium: 4.4 mEq/L (ref 3.5–5.1)
Potassium: 4.8 mEq/L (ref 3.5–5.1)
Potassium: 4.8 mEq/L (ref 3.5–5.1)
Sodium: 122 mEq/L — ABNORMAL LOW (ref 135–145)
Sodium: 124 mEq/L — ABNORMAL LOW (ref 135–145)

## 2013-06-07 LAB — URINE CULTURE: Colony Count: 2000

## 2013-06-07 LAB — CBC
HCT: 32.3 % — ABNORMAL LOW (ref 39.0–52.0)
MCH: 33.4 pg (ref 26.0–34.0)
MCHC: 35.9 g/dL (ref 30.0–36.0)
Platelets: 263 10*3/uL (ref 150–400)
RDW: 15 % (ref 11.5–15.5)
WBC: 11.1 10*3/uL — ABNORMAL HIGH (ref 4.0–10.5)

## 2013-06-07 LAB — TROPONIN I: Troponin I: <0.3 ng/mL (ref <0.30)

## 2013-06-07 MED ORDER — BOOST / RESOURCE BREEZE PO LIQD
1.0000 | ORAL | Status: DC
  Administered 2013-06-08: 1 via ORAL

## 2013-06-07 MED ORDER — ENSURE COMPLETE PO LIQD
237.0000 mL | Freq: Two times a day (BID) | ORAL | Status: DC
  Administered 2013-06-08: 237 mL via ORAL

## 2013-06-07 MED ORDER — SODIUM CHLORIDE 0.9 % IV SOLN
INTRAVENOUS | Status: AC
  Administered 2013-06-07: 10:00:00 via INTRAVENOUS

## 2013-06-07 NOTE — Progress Notes (Signed)
INITIAL NUTRITION ASSESSMENT  DOCUMENTATION CODES Per approved criteria  -Not Applicable   INTERVENTION: Provide Ensure Complete BID Provide Resource breeze once daily Provide Magic Cup once daily Provide Snack once daily Encourage PO intake  NUTRITION DIAGNOSIS: Inadequate oral intake related to poor appetite as evidenced by 15% weight loss in the past several months per pt report.   Goal: Pt to meet >/= 90% of their estimated nutrition needs   Monitor:  PO intake Weight Labs  Reason for Assessment: Malnutrition Screening Tool, score of 3  79 y.o. male  Admitting Dx: Cardiac arrhythmia  ASSESSMENT: 79 y.o. male with PMH as mentioned below came to the hospital for liver biopsy procedure. Before procedure is performed patient developed self limiting tachycardia with subsequent transient hypotensive episode.  Pt reports that his appetite became very poor several months ago at which time he weighed 194 lbs and now has lost down to 165 lbs. Pt states he still eats 3 meals daily but, eats much less than usual. He states he started drinking Ensure supplements a week ago due to losing weight. Pt ate about 25% of lunch and 50% of breakfast today. Encourage pt to snack in between means and to continue use of nutritional supplements to help prevent further weight loss.   Height: Ht Readings from Last 1 Encounters:  06/06/13 6' 1.5" (1.867 m)    Weight: Wt Readings from Last 1 Encounters:  06/06/13 165 lb (74.844 kg)    Ideal Body Weight: 187 lbs  % Ideal Body Weight: 88%  Wt Readings from Last 10 Encounters:  06/06/13 165 lb (74.844 kg)  06/04/13 165 lb 8 oz (75.07 kg)  06/03/13 166 lb (75.297 kg)  04/30/13 194 lb (87.998 kg)  10/23/12 180 lb (81.647 kg)    Usual Body Weight: 194 lbs  % Usual Body Weight: 85%  BMI:  21.47 kg/(m^2)  Estimated Nutritional Needs: Kcal: 1900-2100 Protein: 90-105 grams Fluid: 1.9-2.1 L/day  Skin: intact  Diet Order:  Cardiac  EDUCATION NEEDS: -No education needs identified at this time   Intake/Output Summary (Last 24 hours) at 06/07/13 1355 Last data filed at 06/07/13 0900  Gross per 24 hour  Intake 1302.5 ml  Output    200 ml  Net 1102.5 ml    Last BM: PTA   Labs:   Recent Labs Lab 06/06/13 0504 06/06/13 1204 06/07/13 0307  NA  --  124* 123*  K  --  4.8 4.4  CL  --  89* 90*  CO2  --  26 23  BUN  --  22 19  CREATININE  --  1.16 1.08  CALCIUM  --  8.5 7.8*  MG 2.0  --   --   GLUCOSE  --  86 91    CBG (last 3)  No results found for this basename: GLUCAP,  in the last 72 hours  Scheduled Meds: . sodium chloride   Intravenous STAT  . aspirin  81 mg Oral Daily  . azithromycin  250 mg Oral Daily  . budesonide (PULMICORT) nebulizer solution  0.25 mg Nebulization BID  . feeding supplement (ENSURE COMPLETE)  237 mL Oral TID BM  . finasteride  5 mg Oral Daily  . fluticasone  1 spray Each Nare Daily  . folic acid  1 mg Oral BID  . heparin  5,000 Units Subcutaneous Q8H  . pantoprazole  40 mg Oral Q1200  . sodium chloride  3 mL Intravenous Q12H    Continuous Infusions:   Past  Medical History  Diagnosis Date  . High cholesterol   . Dementia   . Memory loss   . TIA (transient ischemic attack)   . Diverticulosis of colon (without mention of hemorrhage) 04-30-2002  . Hiatal hernia 10-31-2002  . GERD (gastroesophageal reflux disease) 10-31-2002  . Dysrhythmia     Past Surgical History  Procedure Laterality Date  . Abdominal hernia repair    . Cholecystectomy      Ian Malkin RD, LDN Inpatient Clinical Dietitian Pager: [phone] After Hours Pager: [phone]

## 2013-06-07 NOTE — Progress Notes (Signed)
Na + level at 123.

## 2013-06-07 NOTE — Progress Notes (Signed)
[facility] PROGRESS NOTE  NAHSIR VENEZIA NGE:[id number] DOB: 1922/07/17 DOA: 06/06/2013 PCP: Ezequiel Kayser, MD  Assessment/Plan:  Principal Problem:   Cardiac arrhythmia - Cardiology on board and managing - Patient to get further evaluation with echo - Heart rate within last 24 hours has been within normal limits   Active Problems:   Transaminitis:  - most likely related to currently undiagnosed liver pathology    Liver metastasis - Plan is for liver biopsy after discharge    GERD (gastroesophageal reflux disease) - Stable continue proton    Acute bronchitis - Stable continue azithromycin, patient breathing comfortably on room air    Protein-calorie malnutrition, severe - Consult with dietitian, continue Ensure supplementation    Hyponatremia - Most likely due to combination of protein calorie malnutrition and dehydration - Obtain BMP 4 hour as we will administer normal saline and given possibility of neoplastic process would like to make sure this that sodium trends up with IV fluid hydration as he is at risk for SIADH.    Dehydration   Dehydration with hyponatremia  Code Status: full Family Communication: Discussed with spouse Disposition Plan: Pending improvement in oral intake and blood pressure   Consultants:  Cardiology  Procedures:  Echocardiogram  Antibiotics:  Azithromycin  HPI/Subjective: Patient has no new complaints today, no complaints of any chest discomfort.  Objective: Filed Vitals:   06/07/13 0606  BP: 87/55  Pulse: 99  Temp:   Resp: 18    Intake/Output Summary (Last 24 hours) at 06/07/13 1028 Last data filed at 06/07/13 0900  Gross per 24 hour  Intake 1302.5 ml  Output    200 ml  Net 1102.5 ml   There were no vitals filed for this visit.  Exam:   General: Patient in no acute distress, alert and awake  Cardiovascular: Normal S1 and S2, no rubs  Respiratory: Clear to auscultation, no wheezes, no increased  work of breathing  Abdomen: Soft, nontender, nondistended  Musculoskeletal: No cyanosis, no clubbing  Data Reviewed: Basic Metabolic Panel:  Recent Labs Lab 06/06/13 0504 06/06/13 1204 06/07/13 0307  NA  --  124* 123*  K  --  4.8 4.4  CL  --  89* 90*  CO2  --  26 23  GLUCOSE  --  86 91  BUN  --  22 19  CREATININE  --  1.16 1.08  CALCIUM  --  8.5 7.8*  MG 2.0  --   --    Liver Function Tests:  Recent Labs Lab 06/06/13 1204  AST 268*  ALT 148*  ALKPHOS 661*  BILITOT 5.5*  PROT 5.8*  ALBUMIN 2.8*   No results found for this basename: LIPASE, AMYLASE,  in the last 168 hours No results found for this basename: AMMONIA,  in the last 168 hours CBC:  Recent Labs Lab 06/06/13 0922 06/06/13 1241 06/07/13 0307  WBC 12.8* 12.6* 11.1*  NEUTROABS  --  11.0*  --   HGB 13.0 12.5* 11.6*  HCT 35.8* 34.2* 32.3*  MCV 94.0 94.0 93.1  PLT 305 286 263   Cardiac Enzymes:  Recent Labs Lab 06/06/13 0504 06/06/13 2025 06/07/13 0317  CKTOTAL 502*  --   --   CKMB 4.6*  --   --   TROPONINI <0.30 <0.30 <0.30   BNP (last 3 results) No results found for this basename: PROBNP,  in the last 8760 hours CBG: No results found for this basename: GLUCAP,  in the last 168 hours  No results found  for this or any previous visit (from the past 240 hour(s)).   Studies: No results found.  Scheduled Meds: . sodium chloride   Intravenous STAT  . aspirin  81 mg Oral Daily  . azithromycin  250 mg Oral Daily  . budesonide (PULMICORT) nebulizer solution  0.25 mg Nebulization BID  . feeding supplement (ENSURE COMPLETE)  237 mL Oral TID BM  . finasteride  5 mg Oral Daily  . fluticasone  1 spray Each Nare Daily  . folic acid  1 mg Oral BID  . heparin  5,000 Units Subcutaneous Q8H  . pantoprazole  40 mg Oral Q1200  . sodium chloride  3 mL Intravenous Q12H   Continuous Infusions:   Principal Problem:   Cardiac arrhythmia Active Problems:   Memory loss   Transaminitis   Liver  metastasis   GERD (gastroesophageal reflux disease)   Acute bronchitis   Protein-calorie malnutrition, severe   Hyponatremia   Dehydration   Dehydration with hyponatremia    Time spent: > 35 minutes    Penny Pia  [facility] Pager [phone]. If 7PM-7AM, please contact night-coverage at [email or web address], password [email or web address] 06/07/2013, 10:28 AM  LOS: 1 day

## 2013-06-07 NOTE — Consult Note (Signed)
CARDIOLOGY CONSULT NOTE   Patient ID: Randall Francis MRN: [id number] DOB/AGE: December 07, 1922 79 y.o.  Admit date: 06/06/2013  Primary Physician   Ezequiel Kayser, MD Primary Cardiologist   New to [facility] Reason for Consultation   Elevated troponin, PVCs    Past Medical History  Diagnosis Date  . High cholesterol   . Dementia   . Memory loss   . TIA (transient ischemic attack)   . Diverticulosis of colon (without mention of hemorrhage) 04-30-2002  . Hiatal hernia 10-31-2002  . GERD (gastroesophageal reflux disease) 10-31-2002  . Dysrhythmia      Past Surgical History  Procedure Laterality Date  . Abdominal hernia repair    . Cholecystectomy      Allergies  Allergen Reactions  . Aricept [Donepezil Hcl]   . Statins     I have reviewed the patient's current medications Prior to Admission medications   Medication Sig Start Date End Date Taking? Authorizing Provider  acetaminophen (TYLENOL) 325 MG tablet Take 650 mg by mouth daily as needed for mild pain.    Historical Provider, MD  aspirin 81 MG tablet Take 81 mg by mouth daily.    Historical Provider, MD  b complex vitamins tablet Take 1 tablet by mouth daily.    Historical Provider, MD  Calcium Citrate-Vitamin D (CALCIUM CITRATE + D PO) Take 1 tablet by mouth 2 (two) times daily.    Historical Provider, MD  finasteride (PROSCAR) 5 MG tablet Take 5 mg by mouth daily.  08/12/12   Historical Provider, MD  fluticasone (FLONASE) 50 MCG/ACT nasal spray Place 1 spray into both nostrils daily.  10/08/12   Historical Provider, MD  folic acid (FOLVITE) 1 MG tablet Take 1 mg by mouth 2 (two) times daily.    Historical Provider, MD  ibuprofen (ADVIL,MOTRIN) 200 MG tablet Take 200 mg by mouth 2 (two) times daily as needed for mild pain.    Historical Provider, MD  omega-3 acid ethyl esters (LOVAZA) 1 G capsule Take 1 mg by mouth 2 (two) times daily.  10/11/12   Historical Provider, MD  OVER THE COUNTER MEDICATION Take 1 tablet  by mouth 2 (two) times daily. T2 Bone Health    Historical Provider, MD  Simethicone (GAS-X PO) Take 1 tablet by mouth daily.    Historical Provider, MD  valsartan (DIOVAN) 160 MG tablet Take 160 mg by mouth daily.    Historical Provider, MD  zolpidem (AMBIEN) 10 MG tablet Take .25 tab a day 10/14/12   Historical Provider, MD     History   Social History  . Marital Status: Married    Spouse Name: N/A    Number of Children: 4  . Years of Education: N/A   Occupational History  . retired    Social History Main Topics  . Smoking status: Never Smoker   . Smokeless tobacco: Never Used  . Alcohol Use: No  . Drug Use: No  . Sexual Activity: Not on file   Other Topics Concern  . Not on file   Social History Narrative   Patient is married and has 4 children. Pt is retired and worked in [employer] for 50 plus years.     Family Status  Relation Status Death Age  . Mother Deceased   . Father Deceased    Family History  Problem Relation Age of Onset  . Cancer Mother     male cancer ? type  . Hypertension Father  ROS:  Full 14 point review of systems complete and found to be negative unless listed above.  Physical Exam: Blood pressure 87/55, pulse 99, temperature 97.7 F (36.5 C), temperature source Oral, resp. rate 18, SpO2 98.00%.  General: Well developed, slender elderly, male in no acute distress Head: Eyes PERRLA, No xanthomas.   Normocephalic and atraumatic, oropharynx without edema or exudate. Dentition: Poor Lungs: Clear to auscultation bilaterally Heart: HRRR S1 S2, no rub/gallop, no murmur. pulses are 2+ all 4 extrem.  Occasional premature beats.  Neck: No carotid bruits. No lymphadenopathy.  JVD not elevated. Abdomen: Bowel sounds present, abdomen soft and non-tender without masses or hernias noted. Msk:  No spine or cva tenderness. No weakness, no joint deformities or effusions. Extremities: No clubbing or cyanosis. No edema.  Neuro: Alert and oriented X  3. No focal deficits noted. Psych:  Good affect, responds appropriately Skin: No rashes or lesions noted.  Labs:   Lab Results  Component Value Date   WBC 11.1* 06/07/2013   HGB 11.6* 06/07/2013   HCT 32.3* 06/07/2013   MCV 93.1 06/07/2013   PLT 263 06/07/2013    Recent Labs  06/06/13 0922  INR 1.16     Recent Labs Lab 06/06/13 1204 06/07/13 0307  NA 124* 123*  K 4.8 4.4  CL 89* 90*  CO2 26 23  BUN 22 19  CREATININE 1.16 1.08  CALCIUM 8.5 7.8*  PROT 5.8*  --   BILITOT 5.5*  --   ALKPHOS 661*  --   ALT 148*  --   AST 268*  --   GLUCOSE 86 91  ALBUMIN 2.8*  --     Recent Labs  06/06/13 1215  TROPIPOC 0.13*   ECG: 06/06/2013  SR, PVCs, no acute ischemic changes; LVH noted Vent. rate 71 BPM PR interval 252 ms QRS duration 109 ms QT/QTc 442/480 ms P-R-T axes 37 -58 134  Radiology:  Ct Chest Wo Contrast 06/04/2013   CLINICAL DATA:  Liver mets, cough, weight loss  EXAM: CT CHEST WITHOUT CONTRAST  TECHNIQUE: Multidetector CT imaging of the chest was performed following the standard protocol without IV contrast.  COMPARISON:  Correlation with CT abdomen pelvis dated 05/30/2013  FINDINGS: No suspicious pulmonary nodules. Very mild ground-glass opacity in the posterior right upper lobe (series 5/ image 31), likely infectious/inflammatory. Mild dependent atelectasis in the lower lobes with associated trace bilateral pleural effusions. Biapical pleural parenchymal scarring. No pneumothorax.  Visualized thyroid is unremarkable.  Cardiomegaly. No pericardial effusion. Coronary atherosclerosis. Atherosclerotic calcifications of the aortic arch.  No suspicious mediastinal, hilar, or axillary lymphadenopathy.  Visualized upper abdomen is notable for extensive hepatic metastases, better visualized on recent CT abdomen/pelvis.  Mild degenerative changes of the visualized thoracolumbar spine.  IMPRESSION: No findings suspicious for primary malignancy in the chest.  Very mild  ground-glass opacity in the posterior right upper lobe, likely infectious/inflammatory.  Extensive hepatic metastases, better visualized on recent CT abdomen/pelvis.   Electronically Signed   By: Charline Bills M.D.   On: 06/04/2013 15:20    ASSESSMENT AND PLAN:      Active Problems: Elevated troponin:  This was a point of care Troponin.  The "real" lab troponins are negative.   We will check a regular troponin and continue to cycle cardiac enzymes.  . He has frequent PVCs and nonsustained VT is reported so we'll check a TSH and a magnesium. An echocardiogram will be ordered. Further evaluation and treatment depending on the results, but no indication  at this time for aggressive workup as he is asymptomatic.  Hypotension: His blood pressure has improved. It is entirely possible that he was dehydrated. Will leave management to primary care but will order orthostatic vital signs daily.  Will sign off.  Call for questions.  Vesta Mixer, Montez Hageman., MD, [facility] 06/07/2013, 3:14 PM Office - [phone] Pager [phone]

## 2013-06-07 NOTE — Progress Notes (Signed)
Utilization Review Completed.  

## 2013-06-07 NOTE — Progress Notes (Signed)
  Echocardiogram 2D Echocardiogram has been performed.  Naelle Diegel FRANCES 06/07/2013, 10:09 AM

## 2013-06-08 DIAGNOSIS — J209 Acute bronchitis, unspecified: Secondary | ICD-10-CM

## 2013-06-08 DIAGNOSIS — R7989 Other specified abnormal findings of blood chemistry: Secondary | ICD-10-CM

## 2013-06-08 LAB — BASIC METABOLIC PANEL
CO2: 21 mEq/L (ref 19–32)
CO2: 23 mEq/L (ref 19–32)
Calcium: 7.7 mg/dL — ABNORMAL LOW (ref 8.4–10.5)
Calcium: 7.8 mg/dL — ABNORMAL LOW (ref 8.4–10.5)
Chloride: 93 mEq/L — ABNORMAL LOW (ref 96–112)
Chloride: 92 mEq/L — ABNORMAL LOW (ref 96–112)
Creatinine, Ser: 0.98 mg/dL (ref 0.50–1.35)
Creatinine, Ser: 1.05 mg/dL (ref 0.50–1.35)
GFR calc Af Amer: 83 mL/min — ABNORMAL LOW (ref >90)
GFR calc Af Amer: 74 mL/min — ABNORMAL LOW (ref >90)
GFR calc non Af Amer: 71 mL/min — ABNORMAL LOW (ref >90)
GFR calc non Af Amer: 61 mL/min — ABNORMAL LOW (ref >90)
GFR calc non Af Amer: 63 mL/min — ABNORMAL LOW (ref >90)
Potassium: 4.5 mEq/L (ref 3.5–5.1)
Potassium: 4.2 mEq/L (ref 3.5–5.1)
Sodium: 123 mEq/L — ABNORMAL LOW (ref 135–145)
Sodium: 126 mEq/L — ABNORMAL LOW (ref 135–145)
Sodium: 124 mEq/L — ABNORMAL LOW (ref 135–145)

## 2013-06-08 MED ORDER — AZITHROMYCIN 250 MG PO TABS: ORAL_TABLET | ORAL | Status: DC

## 2013-06-08 MED ORDER — ENSURE COMPLETE PO LIQD: 237.0000 mL | Freq: Two times a day (BID) | ORAL | Status: AC

## 2013-06-08 NOTE — Progress Notes (Signed)
Utilization Review completed.  

## 2013-06-08 NOTE — Discharge Summary (Signed)
Physician Discharge Summary  Randall Francis AVW:[id number] DOB: 1922-08-05 DOA: 06/06/2013  PCP: Ezequiel Kayser, MD  Admit date: 06/06/2013 Discharge date: 06/08/2013  Time spent: > 35 minutes  Recommendations for Outpatient Follow-up:  1. Please reassess sodium levels on post hospital follow up 2. Also patient will require liver biopsy for further assessment of liver nodules  Discharge Diagnoses:  Principal Problem:   Cardiac arrhythmia Active Problems:   Memory loss   Transaminitis   Liver metastasis   GERD (gastroesophageal reflux disease)   Acute bronchitis   Protein-calorie malnutrition, severe   Hyponatremia   Dehydration   Dehydration with hyponatremia   Discharge Condition: stable  Diet recommendation: general diet  Filed Weights   06/08/13 0531  Weight: 75.751 kg (167 lb)    History of present illness:  79 y/o male with h/o dementia, GERD, history of recent liver lesions. Patient was at the hospital electively to get liver biopsy when he was found to have soft blood pressures and dehydration.  As such pt was admitted to the hospital for further evaluation and recommendations.  Hospital Course:  Principal Problem:  Cardiac arrhythmia /elevated troponin - Cardiology on board and currently no further plans for assessment.  - I stat troponin elevated but lab troponin normal. No chest pain reported, Echocardiogram showing no regional wall motion abnormality.  Active Problems:   Transaminitis:  - most likely related to currently undiagnosed liver pathology   Liver metastasis  - Plan is for liver biopsy after discharge, patient and family instructed to call pcp to schedule appointment.  GERD (gastroesophageal reflux disease)  - Stable continue proton pump inhibitor  Acute bronchitis  - Stable continue azithromycin to complete a 5 day course, patient breathing comfortably on room air   Protein-calorie malnutrition, severe  - Continue Ensure  supplementation on discharge.  Hyponatremia  - Most likely due to combination of protein calorie malnutrition and dehydration  - improved with normal saline administration - will liberalize diet on discharge.  Dehydration with hyponatremia  - resolved with improved oral intake and IV fluids initially.   - Encourage increased po intake as outpatient.   Procedures:  Echocardiogram: EF 60-65% with no regional wall motion abnormality  Consultations:  Cardiology: Dr. Elease Hashimoto  Discharge Exam: Filed Vitals:   06/08/13 0531  BP: 125/71  Pulse: 73  Temp: 98.3 F (36.8 C)  Resp: 18    General: Pt in NAD, alert and awake Cardiovascular: RRR, no MRG Respiratory: CTA BL, no wheezes, no increased work of breathing  Discharge Instructions  Discharge Orders   Future Appointments Provider Department Dept Phone   06/09/2013 12:15 PM Ladene Artist, MD [facility] MEDICAL ONCOLOGY [phone]   07/23/2013 3:45 PM Marlowe Aschoff, DPM [facility] [phone]   10/28/2013 2:00 PM Melvyn Novas, MD [facility] [phone]   10/29/2013 1:30 PM Marlowe Aschoff, DPM [facility] at [facility] [phone]   Future Orders Complete By Expires   Call MD for:  extreme fatigue  As directed    Call MD for:  persistant dizziness or light-headedness  As directed    Call MD for:  temperature >100.4  As directed    Diet - low sodium heart healthy  As directed    Discharge instructions  As directed    Comments:     Please be sure to follow up with your primary care physician in 1-2 weeks or sooner should any new concerns arise.   Increase activity slowly  As directed        Medication List         acetaminophen 325 MG tablet  Commonly known as:  TYLENOL  Take 650 mg by mouth daily as needed for mild pain.     aspirin 81 MG tablet  Take 81 mg by mouth daily.     azithromycin 250 MG tablet  Commonly known as:  ZITHROMAX  Take one  tab orally daily     b complex vitamins tablet  Take 1 tablet by mouth daily.     CALCIUM CITRATE + D PO  Take 1 tablet by mouth 2 (two) times daily.     DIOVAN 160 MG tablet  Generic drug:  valsartan  Take 160 mg by mouth daily.     feeding supplement (ENSURE COMPLETE) Liqd  Take 237 mLs by mouth 2 (two) times daily between meals.     finasteride 5 MG tablet  Commonly known as:  PROSCAR  Take 5 mg by mouth daily.     fluticasone 50 MCG/ACT nasal spray  Commonly known as:  FLONASE  Place 1 spray into both nostrils daily.     folic acid 1 MG tablet  Commonly known as:  FOLVITE  Take 1 mg by mouth 2 (two) times daily.     GAS-X PO  Take 1 tablet by mouth daily.     ibuprofen 200 MG tablet  Commonly known as:  ADVIL,MOTRIN  Take 200 mg by mouth 2 (two) times daily as needed for mild pain.     omega-3 acid ethyl esters 1 G capsule  Commonly known as:  LOVAZA  Take 1 mg by mouth 2 (two) times daily.     OVER THE COUNTER MEDICATION  Take 1 tablet by mouth 2 (two) times daily. T2 Bone Health     zolpidem 10 MG tablet  Commonly known as:  AMBIEN  Take .25 tab a day       Allergies  Allergen Reactions  . Aricept [Donepezil Hcl]   . Statins       The results of significant diagnostics from this hospitalization (including imaging, microbiology, ancillary and laboratory) are listed below for reference.    Significant Diagnostic Studies: Ct Chest Wo Contrast  06/04/2013   CLINICAL DATA:  Liver mets, cough, weight loss  EXAM: CT CHEST WITHOUT CONTRAST  TECHNIQUE: Multidetector CT imaging of the chest was performed following the standard protocol without IV contrast.  COMPARISON:  Correlation with CT abdomen pelvis dated 05/30/2013  FINDINGS: No suspicious pulmonary nodules. Very mild ground-glass opacity in the posterior right upper lobe (series 5/ image 31), likely infectious/inflammatory. Mild dependent atelectasis in the lower lobes with associated trace bilateral  pleural effusions. Biapical pleural parenchymal scarring. No pneumothorax.  Visualized thyroid is unremarkable.  Cardiomegaly. No pericardial effusion. Coronary atherosclerosis. Atherosclerotic calcifications of the aortic arch.  No suspicious mediastinal, hilar, or axillary lymphadenopathy.  Visualized upper abdomen is notable for extensive hepatic metastases, better visualized on recent CT abdomen/pelvis.  Mild degenerative changes of the visualized thoracolumbar spine.  IMPRESSION: No findings suspicious for primary malignancy in the chest.  Very mild ground-glass opacity in the posterior right upper lobe, likely infectious/inflammatory.  Extensive hepatic metastases, better visualized on recent CT abdomen/pelvis.   Electronically Signed   By: Charline Bills M.D.   On: 06/04/2013 15:20   Ct Abdomen Pelvis W Contrast  05/30/2013   CLINICAL DATA:  79 year old male with weight loss and abnormal LFTs.  EXAM: CT ABDOMEN AND  PELVIS WITH CONTRAST  TECHNIQUE: Multidetector CT imaging of the abdomen and pelvis was performed using the standard protocol following bolus administration of intravenous contrast.  CONTRAST:  100 cc intravenous Omnipaque 300  COMPARISON:  None.  FINDINGS: Cardiomegaly is noted.  Diffuse hypodense lesions throughout the liver are noted compatible with metastatic disease. An index lesion within the lower right liver measures 4 x 5 cm (image 43).  There are a few enlarged gastrohepatic ligament periportal and retroperitoneal lymph nodes present, within the index lymph node measuring 1.5 x 2.4 cm (image 29).  Bilateral renal cortical atrophy noted. A 1.4 x 2.3 cm right renal lesion (84 Hounsfield units) is indeterminate.  The pancreas, adrenal glands and spleen are unremarkable.  There is no evidence of biliary dilatation, abdominal aortic aneurysm or free fluid.  Colonic diverticulosis noted without evidence of diverticulitis. No other definite bowel abnormalities are noted.  A lytic lesion  within the posterior aspect of the S2 sacral segment is noted.  Degenerative changes within the hips and lumbar spine noted.  IMPRESSION: Diffuse hepatic metastases without identifiable primary neoplasm. Probable metastasis within the S2 sacral segment and abdominal/retroperitoneal lymphatic spread.  1.4 x 2.3 cm right renal lesion - indeterminate. This may represent a metastatic lesion, primary neoplasm (unlikely to be the cause of the liver metastases) or complicated cyst.  These results were called by telephone at the time of interpretation on 05/30/2013 at 7:08 PM to Dr. Kevan Ny, who verbally acknowledged these results.   Electronically Signed   By: Laveda Abbe M.D.   On: 05/30/2013 19:09    Microbiology: Recent Results (from the past 240 hour(s))  URINE CULTURE     Status: None   Collection Time    06/06/13  2:15 PM      Result Value Range Status   Specimen Description URINE, RANDOM   Final   Special Requests NONE   Final   Culture  Setup Time     Final   Value: 06/06/2013 17:26     Performed at [facility] Count     Final   Value: 2,000 COLONIES/ML     Performed at [facility]   Culture     Final   Value: INSIGNIFICANT GROWTH     Performed at [facility]   Report Status 06/07/2013 FINAL   Final     Labs: Basic Metabolic Panel:  Recent Labs Lab 06/06/13 0504  06/07/13 0307 06/07/13 1330 06/07/13 1840 06/08/13 0100 06/08/13 0600  NA  --   < > 123* 122* 124* 123* 126*  K  --   < > 4.4 4.8 4.8 4.4 4.5  CL  --   < > 90* 92* 91* 93* 95*  CO2  --   < > 23 21 23 21 23   GLUCOSE  --   < > 91 130* 132* 98 76  BUN  --   < > 19 20 21 20 19   CREATININE  --   < > 1.08 1.04 1.14 0.98 1.05  CALCIUM  --   < > 7.8* 7.8* 7.8* 7.7* 7.8*  MG 2.0  --   --   --   --   --   --   < > = values in this interval not displayed. Liver Function Tests:  Recent Labs Lab 06/06/13 1204  AST 268*  ALT 148*  ALKPHOS 661*  BILITOT 5.5*  PROT 5.8*  ALBUMIN 2.8*    No results found for  this basename: LIPASE, AMYLASE,  in the last 168 hours No results found for this basename: AMMONIA,  in the last 168 hours CBC:  Recent Labs Lab 06/06/13 0922 06/06/13 1241 06/07/13 0307  WBC 12.8* 12.6* 11.1*  NEUTROABS  --  11.0*  --   HGB 13.0 12.5* 11.6*  HCT 35.8* 34.2* 32.3*  MCV 94.0 94.0 93.1  PLT 305 286 263   Cardiac Enzymes:  Recent Labs Lab 06/06/13 0504 06/06/13 2025 06/07/13 0317  CKTOTAL 502*  --   --   CKMB 4.6*  --   --   TROPONINI <0.30 <0.30 <0.30   BNP: BNP (last 3 results) No results found for this basename: PROBNP,  in the last 8760 hours CBG: No results found for this basename: GLUCAP,  in the last 168 hours     Signed:  Penny Pia  [facility] 06/08/2013, 1:25 PM

## 2013-06-09 ENCOUNTER — Telehealth: Payer: Self-pay | Admitting: *Deleted

## 2013-06-09 ENCOUNTER — Other Ambulatory Visit (HOSPITAL_COMMUNITY): Payer: Self-pay | Admitting: Radiology

## 2013-06-09 ENCOUNTER — Ambulatory Visit: Payer: Medicare Other | Admitting: Oncology

## 2013-06-09 NOTE — Telephone Encounter (Signed)
Called to cancel appointment today since the liver biopsy was not able to be done Friday as scheduled. Asking to reschedule. Also asking if there is anything that can be done to help with his weakness? Weakness is progressing. MD mentioned low dose steroid at last visit. Noted biopsy has been rescheduled for 06/11/13.

## 2013-06-10 ENCOUNTER — Other Ambulatory Visit: Payer: Self-pay | Admitting: Radiology

## 2013-06-10 ENCOUNTER — Telehealth: Payer: Self-pay | Admitting: *Deleted

## 2013-06-10 NOTE — Telephone Encounter (Signed)
Gave wife follow up appointment for 06/17/13 at 4:15 with Dr. Truett Perna. Confirmed biopsy is scheduled for tomorrow. She has a bed for him at [facility] rehab facility for tomorrow after the biopsy, since he is so weak and she is not able to manage him at home. Insurance plan allows 30 day rehab stay. She will make facility aware of his appointment so they can assist with transportation.  MD agrees to Prednisone 10 mg daily to help with his energy if she wishes-she has chosen to hold off on this for now.

## 2013-06-11 ENCOUNTER — Ambulatory Visit (HOSPITAL_COMMUNITY)
Admission: RE | Admit: 2013-06-11 | Discharge: 2013-06-11 | Disposition: A | Discharge status: Home / Self Care | Payer: Medicare Other | Source: Ambulatory Visit | Attending: Internal Medicine | Admitting: Internal Medicine

## 2013-06-11 VITALS — BP 100/68 | HR 97 | Temp 97.7°F | Resp 15 | Ht 73.5 in | Wt 166.0 lb

## 2013-06-11 DIAGNOSIS — C787 Secondary malignant neoplasm of liver and intrahepatic bile duct: Secondary | ICD-10-CM | POA: Insufficient documentation

## 2013-06-11 DIAGNOSIS — R17 Unspecified jaundice: Secondary | ICD-10-CM | POA: Insufficient documentation

## 2013-06-11 DIAGNOSIS — K7689 Other specified diseases of liver: Secondary | ICD-10-CM | POA: Insufficient documentation

## 2013-06-11 DIAGNOSIS — R634 Abnormal weight loss: Secondary | ICD-10-CM | POA: Insufficient documentation

## 2013-06-11 DIAGNOSIS — R110 Nausea: Secondary | ICD-10-CM | POA: Insufficient documentation

## 2013-06-11 DIAGNOSIS — K769 Liver disease, unspecified: Secondary | ICD-10-CM

## 2013-06-11 LAB — CBC
Hemoglobin: 12.4 g/dL — ABNORMAL LOW (ref 13.0–17.0)
MCH: 34.6 pg — ABNORMAL HIGH (ref 26.0–34.0)
MCHC: 37 g/dL — ABNORMAL HIGH (ref 30.0–36.0)
Platelets: 298 10*3/uL (ref 150–400)
RDW: 16 % — ABNORMAL HIGH (ref 11.5–15.5)
WBC: 13.1 10*3/uL — ABNORMAL HIGH (ref 4.0–10.5)

## 2013-06-11 LAB — PROTIME-INR: Prothrombin Time: 15 seconds (ref 11.6–15.2)

## 2013-06-11 LAB — APTT: aPTT: 29 seconds (ref 24–37)

## 2013-06-11 MED ORDER — MIDAZOLAM HCL 2 MG/2ML IJ SOLN
INTRAMUSCULAR | Status: AC
  Filled 2013-06-11: qty 2

## 2013-06-11 MED ORDER — SODIUM CHLORIDE 0.9 % IV SOLN: Freq: Once | INTRAVENOUS | Status: DC

## 2013-06-11 MED ORDER — FENTANYL CITRATE 0.05 MG/ML IJ SOLN
INTRAMUSCULAR | Status: AC | PRN
  Administered 2013-06-11: 25 ug via INTRAVENOUS

## 2013-06-11 MED ORDER — FENTANYL CITRATE 0.05 MG/ML IJ SOLN
INTRAMUSCULAR | Status: AC
  Filled 2013-06-11: qty 2

## 2013-06-11 MED ORDER — MIDAZOLAM HCL 2 MG/2ML IJ SOLN
INTRAMUSCULAR | Status: AC | PRN
  Administered 2013-06-11: 1 mg via INTRAVENOUS

## 2013-06-11 NOTE — H&P (Signed)
Randall Francis is an 79 y.o. male.   Chief Complaint: several weeks of fatigue, nausea; wt loss; abd pain 10 days noticeable jaundice Work up with Randall Francis reveals liver lesions Scheduled for liver lesion biopsy today  Pt was scheduled for this same procedure 12/26 but was not performed secondary "irreg heart rate" per pt and wife Pt has since seen cardiologist 12/27: frequent PVCs and nonsustained V tach. Pt is asymptomatic - no aggressive work up was recommended. Echo 12/27: wnl  HPI: HLD; mild dementia; TIA; GERD; dysrythmia  Past Medical History  Diagnosis Date  . High cholesterol   . Dementia   . Memory loss   . TIA (transient ischemic attack)   . Diverticulosis of colon (without mention of hemorrhage) 04-30-2002  . Hiatal hernia 10-31-2002  . GERD (gastroesophageal reflux disease) 10-31-2002  . Dysrhythmia     Past Surgical History  Procedure Laterality Date  . Abdominal hernia repair    . Cholecystectomy      Family History  Problem Relation Age of Onset  . Cancer Mother     male cancer ? type  . Hypertension Father    Social History:  reports that he has never smoked. He has never used smokeless tobacco. He reports that he does not drink alcohol or use illicit drugs.  Allergies:  Allergies  Allergen Reactions  . Aricept [Donepezil Hcl]   . Statins      (Not in a hospital admission)  No results found for this or any previous visit (from the past 48 hour(s)). No results found.  Review of Systems  Constitutional: Positive for weight loss and malaise/fatigue. Negative for fever.  Respiratory: Negative for shortness of breath.   Cardiovascular: Negative for chest pain.  Gastrointestinal: Positive for nausea and abdominal pain. Negative for vomiting.  Neurological: Positive for weakness. Negative for dizziness.    Blood pressure 108/67, pulse 73, temperature 97.2 F (36.2 C), temperature source Axillary, resp. rate 18, height 6' 1.5" (1.867 m),  weight 166 lb (75.297 kg), SpO2 99.00%. Physical Exam  Constitutional: He is oriented to person, place, and time.  Thin; frail  Cardiovascular: Normal rate.   No murmur heard. Irreg  Respiratory: Effort normal and breath sounds normal. He has no wheezes.  GI: Soft. Bowel sounds are normal. There is no tenderness.  Musculoskeletal: Normal range of motion.  Neurological: He is alert and oriented to person, place, and time.  Skin: Skin is warm and dry.  jaundice  Psychiatric: He has a normal mood and affect. His behavior is normal. Judgment and thought content normal.     Assessment/Plan Liver lesions Wt loss; fatigue Scheduled for biopsy in IR today Pt and wife aware of procedure benefits and risks and agreeable to proceed Consent signed and in chart  Randall Francis A 06/11/2013, 1:34 PM

## 2013-06-11 NOTE — Progress Notes (Signed)
Pt discharged to [facility] with daughters and son and wife. Discharge instructions given to family and called to charge nurse Lynden Ang at [place].  Written instructions given to family to put in pts chart at [facility].

## 2013-06-11 NOTE — Procedures (Signed)
Interventional Radiology Procedure Note  Procedure:  US guided core biopsy of right liver Complications: None immediate Recommendations: - Bedrest x 3 hrs - ADAT - Path pending  Signed,  Sterling Big, MD [facility] Radiology

## 2013-06-13 ENCOUNTER — Non-Acute Institutional Stay (SKILLED_NURSING_FACILITY): Payer: Medicare Other | Admitting: Geriatric Medicine

## 2013-06-13 ENCOUNTER — Telehealth: Payer: Self-pay | Admitting: Oncology

## 2013-06-13 DIAGNOSIS — B370 Candidal stomatitis: Secondary | ICD-10-CM

## 2013-06-13 DIAGNOSIS — C801 Malignant (primary) neoplasm, unspecified: Secondary | ICD-10-CM

## 2013-06-13 DIAGNOSIS — E43 Unspecified severe protein-calorie malnutrition: Secondary | ICD-10-CM

## 2013-06-13 DIAGNOSIS — C787 Secondary malignant neoplasm of liver and intrahepatic bile duct: Secondary | ICD-10-CM

## 2013-06-13 NOTE — Telephone Encounter (Signed)
LEFT MESSAGE AND GVE NEW DATE/TIME FOR 01/05 @ 3 PER MD. INSTRUCTED PT TO RETURN CALL TO CONFIRM MESSAGE WAS RECEIVED.

## 2013-06-16 ENCOUNTER — Ambulatory Visit (HOSPITAL_BASED_OUTPATIENT_CLINIC_OR_DEPARTMENT_OTHER): Payer: Medicare Other | Admitting: Oncology

## 2013-06-16 ENCOUNTER — Encounter: Payer: Self-pay | Admitting: Geriatric Medicine

## 2013-06-16 ENCOUNTER — Telehealth: Payer: Self-pay | Admitting: *Deleted

## 2013-06-16 VITALS — BP 94/60 | HR 84 | Temp 96.8°F | Resp 17 | Ht 73.5 in

## 2013-06-16 DIAGNOSIS — C787 Secondary malignant neoplasm of liver and intrahepatic bile duct: Secondary | ICD-10-CM

## 2013-06-16 DIAGNOSIS — I959 Hypotension, unspecified: Secondary | ICD-10-CM

## 2013-06-16 DIAGNOSIS — R630 Anorexia: Secondary | ICD-10-CM

## 2013-06-16 DIAGNOSIS — M216X9 Other acquired deformities of unspecified foot: Secondary | ICD-10-CM

## 2013-06-16 DIAGNOSIS — C801 Malignant (primary) neoplasm, unspecified: Secondary | ICD-10-CM

## 2013-06-16 MED ORDER — PREDNISONE 10 MG PO TABS: 10.0000 mg | ORAL_TABLET | Freq: Every day | ORAL | Status: DC

## 2013-06-16 NOTE — Progress Notes (Signed)
Patient requested apple juice from nurse upon entry to room-quickly drank 8 ounces of juice. Declined refill. Patient is DNR at [facility]. Has not been able to begin rehab program due to his extreme weakness-is not able to stand. Is hypotensive. Has not had any falls, but is a fall risk. In office in [place] with his wife and daughter present. Transportation service from facility brought him to appointment today. Called facility to request a copy of his MAR that was not provided in his paperwork today. Obvious jaundice noted.

## 2013-06-16 NOTE — Assessment & Plan Note (Signed)
Related to hepatic disease. Encourage PO intake as he is able, including Ensure supplement.

## 2013-06-16 NOTE — Assessment & Plan Note (Signed)
Treat with Magic mouthwash, encourage water PO

## 2013-06-16 NOTE — Telephone Encounter (Signed)
Barnett Applebaum called back to report Dr Benay Spice had asked her to call us prior to seeing the pt and once seen and after talking with the family, he is being referred to Hospice. She thanked [place]. Claiborne Billings will call [facility] to cancel the test.

## 2013-06-16 NOTE — Telephone Encounter (Signed)
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 Clinical correlation is strongly recommended  Dr Sharlett Iles, Claiborne Billings and [facility]  Staff have called pt who has not turned in his specimen stating he will do it after the holidays. Do you need him to do this or does he need to be scheduled for a COLON?  Please advise. Thanks.

## 2013-06-16 NOTE — Addendum Note (Signed)
Addended by: Tania Ade on: 06/16/2013 05:44 PM   Modules accepted: Orders

## 2013-06-16 NOTE — Progress Notes (Signed)
Patient ID: Randall Francis, male   DOB: 01-12-1923, 80 y.o.   MRN: [id number]  [facility] [phone])  Code Status: Living WIll, DNR  Contact Information   Name Relation Home Work [place] Spouse [phone]  [phone]   Shahil, Granado [phone]     Mayeda,Louise Daughter [phone]       Chief Complaint  Patient presents with  . Hepatic Disease  . Debility    HPI: This  80 y.o. male was admitted to the rehabilitation section at [facility] late 06/11/2013 after liver biopsy. This patietn has had a rapid decline in the last month, with poor appetite, significant weight loss. Evaluation found evidence of liver metastases, primary site has not been identified. The patient was hospitalized last week due to hypotension, hyponatremia and dehydration. He was scheduled for liver biopsy but this could not be done due to his unstable hemodynamic status. His condition stabilized , he was discharged and returned 06/11/13 for a liver biopsy. This was completed without incident.  Yesterday, he had more difficulty with nausea and poor p.o. intake. The family requested IV fluids, he has been receiving normal saline via peripheral IV at 50 cc an hour. The patient tells me he is feeling a little better today and was able to eat breakfast.   Allergies  Allergen Reactions  . Aricept [Donepezil Hcl]   . Statins    Medications: Patient's Medications  New Prescriptions   No medications on file  Previous Medications   ACETAMINOPHEN (TYLENOL) 325 MG TABLET    Take 650 mg by mouth daily as needed for mild pain.   ASPIRIN 81 MG TABLET    Take 81 mg by mouth daily.   AZITHROMYCIN (ZITHROMAX) 250 MG TABLET    Take one tab orally daily   B COMPLEX VITAMINS TABLET    Take 1 tablet by mouth daily.   CALCIUM CITRATE-VITAMIN D (CALCIUM CITRATE + D PO)    Take 1 tablet by mouth 2 (two) times daily.   FEEDING SUPPLEMENT, ENSURE COMPLETE, (ENSURE  COMPLETE) LIQD    Take 237 mLs by mouth 2 (two) times daily between meals.   FINASTERIDE (PROSCAR) 5 MG TABLET    Take 5 mg by mouth daily.    FLUTICASONE (FLONASE) 50 MCG/ACT NASAL SPRAY    Place 1 spray into both nostrils daily.    FOLIC ACID (FOLVITE) 1 MG TABLET    Take 1 mg by mouth 2 (two) times daily.   IBUPROFEN (ADVIL,MOTRIN) 200 MG TABLET    Take 200 mg by mouth 2 (two) times daily as needed for mild pain.   OMEGA-3 ACID ETHYL ESTERS (LOVAZA) 1 G CAPSULE    Take 1 mg by mouth 2 (two) times daily.    OVER THE COUNTER MEDICATION    Take 1 tablet by mouth 2 (two) times daily. T2 [facility]   SIMETHICONE (GAS-X PO)    Take 1 tablet by mouth daily.   VALSARTAN (DIOVAN) 160 MG TABLET    Take 160 mg by mouth daily.   ZOLPIDEM (AMBIEN) 10 MG TABLET    Take .25 tab a day  Modified Medications   No medications on file  Discontinued Medications   No medications on file    DATA REVIEWED  Radiological  05/30/2013 CT ABDOMEN AND PELVIS WITH CONTRAST  COMPARISON:  None. IMPRESSION: Diffuse hepatic metastases without identifiable primary neoplasm.  Probable metastasis within the S2 sacral segment and abdominal/retroperitoneal lymphatic spread.   1.4 x  2.3 cm right renal lesion - indeterminate. This may represent a metastatic lesion, primary neoplasm (unlikely to be the cause of he liver metastases) or complicated cyst.    52/77/8242 CT CHEST WITHOUT CONTRAST   COMPARISON:  Correlation with CT abdomen pelvis dated 05/30/2013 IMPRESSION: No findings suspicious for primary malignancy in the chest.   Very mild ground-glass opacity in the posterior right upper lobe, likely infectious/inflammatory.   Extensive hepatic metastases, better visualized on recent CT abdomen/pelvis.    Cardiovascular  06/07/2013 Transthoracic Echocardiography Study Conclusions - Left ventricle: The cavity size was normal. There was severe concentric hypertrophy. Systolic function was normal. The estimated  ejection fraction was in the range   of 60% to 65%. There was no dynamic obstruction. Wall motion was normal; there were no regional wall motion abnormalities. Doppler parameters are consistent  with  abnormal left ventricular relaxation (grade 1 diastolic dysfunction). - Aorta: Aortic root dimension: 5mm (ED) at the sinus of Valsalva. Mild to moderately dilated. Impressions: - No cardiac source of emboli was indentified.   Laboratory:  Recent Labs  06/06/13 0504  06/08/13 0100 06/08/13 0600 06/08/13 1240  NA  --   < > 123* 126* 124*  K  --   < > 4.4 4.5 4.2  CL  --   < > 93* 95* 92*  CO2  --   < > 21 23 23   GLUCOSE  --   < > 98 76 118*  BUN  --   < > 20 19 19   CREATININE  --   < > 0.98 1.05 1.02  CALCIUM  --   < > 7.7* 7.8* 7.8*  MG 2.0  --   --   --   --   < > = values in this interval not displayed. Liver Function Tests:  Recent Labs  06/06/13 1204  AST 268*  ALT 148*  ALKPHOS 661*  BILITOT 5.5*  PROT 5.8*  ALBUMIN 2.8*   No results found for this basename: LIPASE, AMYLASE,  in the last 8760 hours No results found for this basename: AMMONIA,  in the last 8760 hours CBC:  Recent Labs  06/06/13 0922 06/06/13 1241 06/07/13 0307 06/11/13 1321  WBC 12.8* 12.6* 11.1* 13.1*  NEUTROABS  --  11.0*  --   --   HGB 13.0 12.5* 11.6* 12.4*  HCT 35.8* 34.2* 32.3* 33.5*  MCV 94.0 94.0 93.1 93.6  PLT 305 286 263 298   Cardiac Enzymes:  Recent Labs  06/06/13 0504 06/06/13 2025 06/07/13 0317  CKTOTAL 502*  --   --   CKMB 4.6*  --   --   TROPONINI <0.30 <0.30 <0.30     Past Medical History  Diagnosis Date  . High cholesterol   . Dementia   . Memory loss   . TIA (transient ischemic attack)   . Diverticulosis of colon (without mention of hemorrhage) 04-30-2002  . Hiatal hernia 10-31-2002  . GERD (gastroesophageal reflux disease) 10-31-2002  . Dysrhythmia    Past Surgical History  Procedure Laterality Date  . Abdominal hernia repair    . Cholecystectomy      Social History:   reports that he has never smoked. He has never used smokeless tobacco. He reports that he does not drink alcohol or use illicit drugs. History   Social History Narrative   Patient is married and has 4 children. Pt is retired and worked in [employer] for 50 plus years.    Family History  Problem  Relation Age of Onset  . Cancer Mother     male cancer ? type  . Hypertension Father     Review of Systems:  DATA OBTAINED: from patient, medical record, nurse GENERAL: Feels weak, fatigued, poor appetite, weight loss (from 194 to 166lb) SKIN: No itch, rash or open wounds EYES: No eye pain, dryness or itching  No change in vision EARS: No earache, change in hearing NOSE: No congestion, drainage or bleeding MOUTH/THROAT: No mouth or tooth pain   No sore throat    No difficulty chewing or swallowing RESPIRATORY: No cough, wheezing, SOB CARDIAC: No chest pain,  No edema. GI: No abdominal pain  Mild nausea no vomiting. Little BM GU: No dysuria, frequency or urgency  No change in urine volume or character    No nocturia or change in stream   MUSCULOSKELETAL: No joint pain, swelling or stiffness  No back pain  No muscle ache, pain,  Generalized weakness present,  Gait is unsteady    NEUROLOGIC: No dizziness, fainting, headache, "My memory is poor" .  PSYCHIATRIC: No feelings of anxiety, depression   Sleeps well.  No behavior issue.   Physical Exam: Filed Vitals:   06/13/13 1447  BP: 120/71  Pulse: 68  Temp: 97.6 F (36.4 C)  Resp: 18   GENERAL APPEARANCE: No acute distress, appropriately groomed, Thin, cachectic body habitus. Alert, pleasant, conversant. SKIN: No diaphoresis, rash, unusual lesions, wounds. Skin is jaundiced HEAD: Normocephalic, atraumatic EYES: Conjunctiva/lids clear. Sclera icteric Pupils round, reactive. Marland Kitchen  EARS: External exam WNL,. Hearing grossly normal. NOSE: No deformity or discharge. MOUTH/THROAT: Lips w/o lesions. Oral mucosa,  tongue moist, multiple lesions on each buccal surface consistent with Candida  NECK: Supple, full ROM. No thyroid tenderness, enlargement or nodule LYMPHATICS: No head, neck or supraclavicular adenopathy RESPIRATORY: Breathing is even, unlabored. Lung sounds are clear and full.  CARDIOVASCULAR: Heart RRR. No murmur or extra heart sounds  ARTERIAL: No carotid bruit.  Femoral pulse 2+, Left DP 1+, Rt DP absent  VENOUS: No varicosities. No venous stasis skin changes  EDEMA: Trace LE  edema. No ascites GASTROINTESTINAL:  Abdomen is tender right upper quadrant, hepatomegaly evident, right flank is tender as well.   MUSCULOSKELETAL: Moves all extremities with full ROM, strength and tone.  NEUROLOGIC: Not oriented to time, Oriented to place, person. Cranial nerves 2-12 grossly intact, speech clear, no tremor.  PSYCHIATRIC: Mood and affect appropriate to situation   Assessment/Plan  Liver metastasis Abnormal LFTs, CT abdomen, now s/p liver biopsy- pathology pending. Jaundice present, poor PO intake though improved after 2 L NS IV. Continue supportive care. For oncology follow up next week to discuss biopsy results and treatment options.  Oral candida Treat with Magic mouthwash, encourage water PO  Protein-calorie malnutrition, severe Related to hepatic disease. Encourage PO intake as he is able, including Ensure supplement.   Follow up: As needed  Derriana Oser T.Royale Lennartz, NP-C 06/13/2013

## 2013-06-16 NOTE — Assessment & Plan Note (Signed)
Abnormal LFTs, CT abdomen, now s/p liver biopsy- pathology pending. Jaundice present, poor PO intake though improved after 2 L NS IV. Continue supportive care. For oncology follow up next week to discuss biopsy results and treatment options.

## 2013-06-16 NOTE — Progress Notes (Signed)
[place]    OFFICE PROGRESS NOTE   INTERVAL HISTORY:   Randall Francis was admitted on 06/06/2013. He presented for a liver biopsy that day and was noted to have hypotension and an arrhythmia. He received intravenous hydration and was discharged to home on 06/08/2013.  He presents today with his wife and daughter. They report he has become significantly "weaker "since he was last seen here. He was admitted to the rehabilitation facility at [place] on 06/11/2013. He stays in the bed most of the time and has anorexia. He requires assistance for all of his care Randall Francis underwent an ultrasound-guided biopsy of the liver on 06/11/2013. The pathology ([id number]) revealed metastatic adenocarcinoma. Immunohistochemical stains found the tumor cells to be positive for cytokeratin 20 and CDX-2. The cells were negative for cytokeratin 7, PSA, and TTF-1. The immunophenotypic features were compatible with an intestinal primary, likely colonic.  Randall Francis reports insomnia and requests an increase in the Ambien dose. The family has noted somnolence today. He had transient pain in the right abdomen.   Objective:  Vital signs in last 24 hours:  Blood pressure 94/60, pulse 84, temperature 96.8 F (36 C), temperature source Oral, resp. rate 17, height 6' 1.5" (1.867 m).    HEENT: Scleral icterus,? Very early buccal thrush Resp: Decreased breath sounds at the right lower chest, no respiratory distress Cardio: Regular rate and rhythm, distant heart GI: The liver is palpable in the right mid abdomen Vascular: Pitting edema at the low leg and foot bilaterally Neuro: Alert, follows commands, confused  Skin: Jaundice     Medications: I have reviewed the patient's current medications.  Assessment/Plan: 1. CT abdomen/pelvis 05/30/2013 with diffuse hypodense lesions throughout the liver; a few enlarged gastrohepatic ligament, periportal and retroperitoneal lymph nodes; 1.4 x  2.3 cm right renal lesion, indeterminate; lytic lesion within the posterior aspect of the S2 sacral segment.  Ultrasound-guided biopsy of the liver on 06/11/2013 confirmed metastatic adenocarcinoma, immunophenotype consistent with a gastrointestinal primary 2. Anorexia, weight loss, fatigue secondary to liver metastases. 3. Hypotension.  4. Mild right foot drop. Question due to peroneal nerve compression related to weight loss  Disposition:  Randall Francis is been diagnosed with metastatic adenocarcinoma involving the liver. The immunophenotype of  the liver biopsy is consistent with a gastrointestinal primary. I discussed the diagnosis of metastatic adenocarcinoma with Randall Francis and his family. They understand he may have metastatic colon cancer, but the differential diagnosis includes metastatic adenocarcinoma from other gastrointestinal sites and unknown primary carcinoma.  We discussed the indication for additional diagnostic testing such as a colonoscopy, PET scan, and molecular testing in an attempt to better define a primary tumor site. He has a poor performance status, ECOG 4. I do not recommend additional diagnostic testing since he is not a candidate for systemic therapy.  I recommend a [facility] hospice referral. Randall Francis and his family agree to hospice care. He has already been placed on a no CODE BLUE status. I recommend discontinuing nonessential medications. We decided to add low-dose prednisone with the hope of improving his appetite and energy level.  I spoke with his wife and daughter outside of the examination room and estimated his lifespan to be a few weeks.  I suspect he is developing hepatic encephalopathy.  We will contact Dr. Joylene Draft to see if he will serve as the primary hospice physician. Randall Francis is not scheduled for a followup appointment at the [facility]. We will see him in the  future as needed.  Approximately 45 minutes were spent with the  patient and his family today. The majority of the time was used for counseling and coordination of care.   Betsy Coder, MD  06/16/2013  5:08 PM

## 2013-06-17 ENCOUNTER — Encounter: Payer: Self-pay | Admitting: *Deleted

## 2013-06-17 ENCOUNTER — Ambulatory Visit: Payer: Medicare Other | Admitting: Oncology

## 2013-06-17 NOTE — Progress Notes (Signed)
Confirmed with Dr. Silvestre Mesi nurse that he will be the attending physician for [facility] referral made by Dr. Benay Spice. Hospice referral sheet faxed to intake nurse at [phone]

## 2013-06-20 ENCOUNTER — Telehealth: Payer: Self-pay | Admitting: *Deleted

## 2013-06-20 NOTE — Telephone Encounter (Signed)
Message from pt's wife reporting he has been drowsy since increasing his Ambien to 5 mg. Requests dose to be decreased back to 2.5 mg. Spoke with Manuela Schwartz, RN at [facility]. Pt has been resting better at night. They are not sure the Ambien is causing pt to be drowsy during the day, it is helping him to rest at night. RN is requesting Ambien 2.5-5mg   PRN sleep. Reviewed with Dr. Benay Spice: order received for above.

## 2013-06-23 ENCOUNTER — Encounter: Payer: Self-pay | Admitting: Geriatric Medicine

## 2013-06-23 ENCOUNTER — Non-Acute Institutional Stay (SKILLED_NURSING_FACILITY): Payer: Medicare Other | Admitting: Geriatric Medicine

## 2013-06-23 DIAGNOSIS — C787 Secondary malignant neoplasm of liver and intrahepatic bile duct: Secondary | ICD-10-CM

## 2013-06-23 HISTORY — DX: Secondary malignant neoplasm of liver and intrahepatic bile duct: C78.7

## 2013-06-26 NOTE — Assessment & Plan Note (Addendum)
Patient has been to oncology where biopsy results were reviewed, available treatments would not provide any benefit. Hospice care has been recommended and is in place. Patient appears comfortable at this time, cough or medications are available if needed. Family expresses their desire for comfort as only priority. Anticipate end-of-life in days to one week. Simplify medication list

## 2013-07-13 NOTE — Progress Notes (Signed)
Patient ID: Randall Francis, male   DOB: 11-20-1922, 80 y.o.   MRN: [id number]  [facility] (31)  Code Status: Living WIll, DNR. Hospice Care  Contact Information   Name Relation Home Work Mobile   Tremain,Margot Spouse [phone]  [phone]   Chioke, Noxon [phone]     Bossman,Louise Daughter [phone]       Chief Complaint  Patient presents with  . metastatic adenocarcinoma    HPI: This  80 y.o. male was admitted to the rehabilitation section at [place] late 06/11/2013 after liver biopsy. This patient had a rapid decline in the prior month, with poor appetite, significant weight loss. Evaluation found evidence of liver metastases, primary site has not been identified. He was scheduled for liver biopsy 06/06/13 but this could not be done due to his unstable hemodynamic status. His condition stabilized, he was discharged and returned 06/11/13 for a liver biopsy. This was completed without incident.   Last visit: Liver metastasis Abnormal LFTs, CT abdomen, now s/p liver biopsy- pathology pending. Jaundice present, poor PO intake though improved after 2 L NS IV. Continue supportive care. For oncology follow up next week to discuss biopsy results and treatment options.  Oral candida Treat with Magic mouthwash, encourage water PO  Protein-calorie malnutrition, severe Related to hepatic disease. Encourage PO intake as he is able, including Ensure supplement.  Since last visit  Oncology visit was completed, no treatment for liver cancer is planned. Hospice care as been recommended and is now in place. Patient has continued general decline; remains essentially pain free, has taken very little p.o. last few day,is sleeping more. Family remains very close and supportive.  Allergies  Allergen Reactions  . Aricept [Donepezil Hcl]   . Statins   . Fenofibrate   . Flomax [Tamsulosin Hcl]   . Gemfibrozil   . Namenda [Memantine Hcl]    . Niacin And Related   . Protonix [Pantoprazole Sodium]   . Sulfa Antibiotics   . Welchol [Colesevelam Hcl]   . Zetia [Ezetimibe]    MEDICATION - reviewed   DATA REVIEWED  Radiological  05/30/2013 CT ABDOMEN AND PELVIS WITH CONTRAST  COMPARISON:  None. IMPRESSION: Diffuse hepatic metastases without identifiable primary neoplasm.  Probable metastasis within the S2 sacral segment and abdominal/retroperitoneal lymphatic spread.   1.4 x 2.3 cm right renal lesion - indeterminate. This may represent a metastatic lesion, primary neoplasm (unlikely to be the cause of he liver metastases) or complicated cyst.    93/79/0240 CT CHEST WITHOUT CONTRAST   COMPARISON:  Correlation with CT abdomen pelvis dated 05/30/2013 IMPRESSION: No findings suspicious for primary malignancy in the chest.   Very mild ground-glass opacity in the posterior right upper lobe, likely infectious/inflammatory.   Extensive hepatic metastases, better visualized on recent CT abdomen/pelvis.    Cardiovascular  06/07/2013 Transthoracic Echocardiography Study Conclusions - Left ventricle: The cavity size was normal. There was severe concentric hypertrophy. Systolic function was normal. The estimated ejection fraction was in the range   of 60% to 65%. There was no dynamic obstruction. Wall motion was normal; there were no regional wall motion abnormalities. Doppler parameters are consistent  with  abnormal left ventricular relaxation (grade 1 diastolic dysfunction). - Aorta: Aortic root dimension: 35mm (ED) at the sinus of Valsalva. Mild to moderately dilated. Impressions: - No cardiac source of emboli was indentified.   [facility]  Component Value Date   WBC 13.1* 06/11/2013   HGB 12.4* 06/11/2013   HCT 33.5*  06/11/2013   PLT 298 06/11/2013        GLUCOSE 118* 06/08/2013   ALT 148* 06/06/2013   AST 268* 06/06/2013   NA 124* 06/08/2013   K 4.2 06/08/2013   CL 92* 06/08/2013    CREATININE 1.02 06/08/2013   BUN 19 06/08/2013   CO2 23 06/08/2013        TSH 5.062* 06/06/2013   INR 1.21 06/11/2013    Social History:   History   Social History Narrative   Patient is married and has 4 children. Pt is retired and worked in [employer] for 50 plus years. Was very active in retirement until last few months     REVIEW OF SYSTEMS  DATA OBTAINED: from  medical record, nurse, family members GENERAL: Feels weak,  minimal PO intake SKIN: No itch, rash or open wounds NOSE: No congestion, drainage or bleeding MOUTH/THROAT: No mouth or tooth pain   No sore throat     RESPIRATORY: No cough, wheezing,  CARDIAC: No chest pain,  No edema. GI: No abdominal pain  No nausea, no vomiting. No BM last several days GU: No dysuria, decreased urine volume       MUSCULOSKELETAL: No joint pain,  No back pain  No muscle ache, pain,  Generalized weakness present, NEUROLOGIC: Decreased interactions PSYCHIATRIC: No signs of anxiety   Sleeps well.  No behavior issue.   PHYSICAL EXAM  Filed Vitals:   07/18/13 1631  BP: 102/65  Pulse: 76  Resp: 22  SpO2: 92%   GENERAL APPEARANCE: No acute distress, appropriately groomed, Thin, cachectic body habitus. Asleep, awakens briefly. SKIN: No diaphoresis, rash, unusual lesions, wounds. Skin is jaundiced HEAD: Normocephalic, atraumatic EYES: Conjunctiva/lids clear. Sclera icteric Pupils round, reactive. Marland Kitchen  NOSE: No deformity or discharge. MOUTH/THROAT: Lips w/o lesions, dry. Oral mucosa, tongue dry, no lesions  RESPIRATORY: Breathing is even, unlabored, shallow. Lung sounds are clear anterior.  CARDIOVASCULAR: Heart RRR. No murmur or extra heart sounds  GASTROINTESTINAL:  Abdomen is tender right upper quadrant, hepatomegaly evident, right flank is tender as well.   NEUROLOGIC: Orientation not assessed to time,  PSYCHIATRIC: Withdrawn  Assessment/Plan  Metastatic adenocarcinoma to liver Patient has been to oncology where biopsy  results were reviewed, available treatments would not provide any benefit. Hospice care has been recommended and is in place. Patient appears comfortable at this time, cough or medications are available if needed. Family expresses their desire for comfort as only priority. Anticipate end-of-life in days to one week   Follow up: As needed  Eustacia Urbanek T.Marigny Borre, NP-C 2013/07/18

## 2013-07-13 DEATH — deceased

## 2013-07-23 ENCOUNTER — Ambulatory Visit: Payer: Medicare Other | Admitting: Podiatrist

## 2013-10-28 ENCOUNTER — Ambulatory Visit: Payer: Medicare Other | Admitting: Neurology

## 2013-10-29 ENCOUNTER — Ambulatory Visit: Payer: Medicare Other | Admitting: Podiatrist

## 2014-08-28 IMAGING — CT CT CHEST W/O CM
2 of 4 series · 15 of 36 positions shown, 18 images · non-contrast
Comparison: Correlation with CT abdomen pelvis dated 05/30/2013

CLINICAL DATA: Liver mets, cough, weight loss

EXAM:
CT CHEST WITHOUT CONTRAST
TECHNIQUE: Multidetector CT imaging of the chest was performed following the
standard protocol without IV contrast.

[Series 2: chest w/o st · axial · non-contrast · 0.83mm/px · z∈[+1134,+1424]mm · 12 of 70 slices shown, 15 images]
[im 6/70  mediastinal]
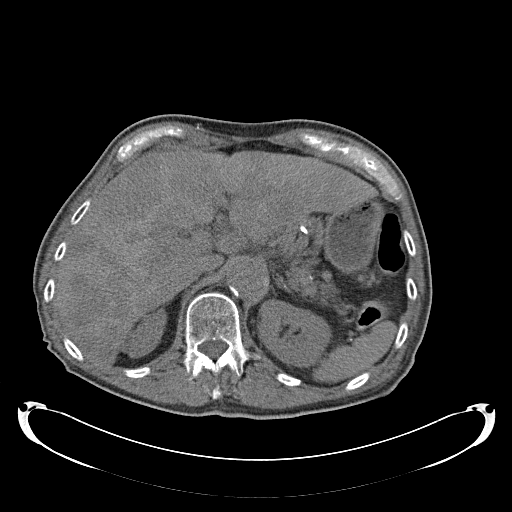
[im 6/70  lung]
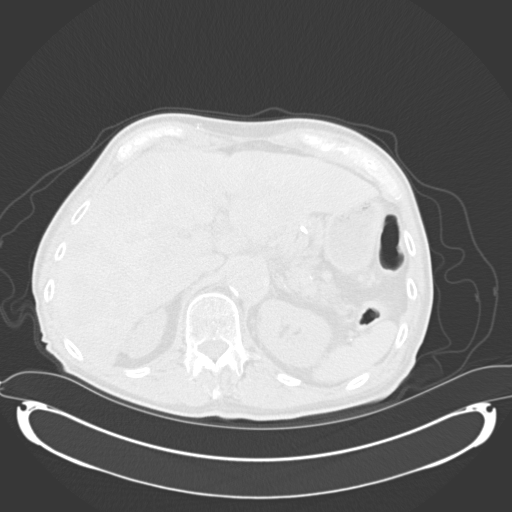
[im 11/70  lung]
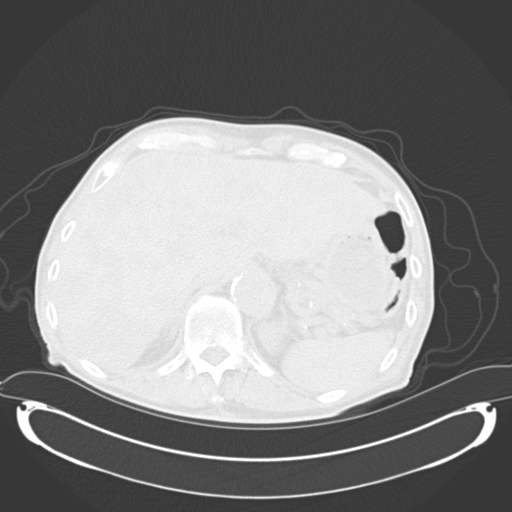
[im 16/70  lung]
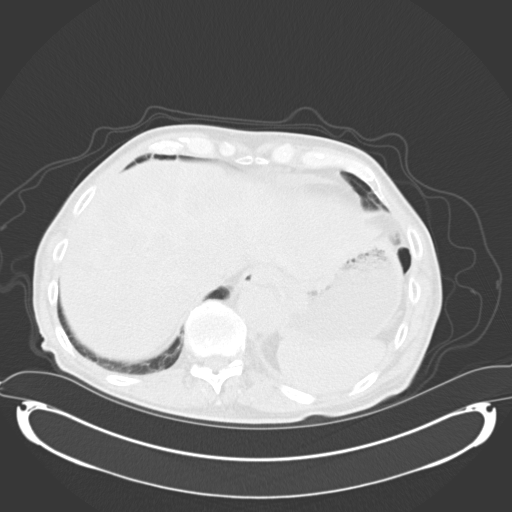
[im 22/70  lung]
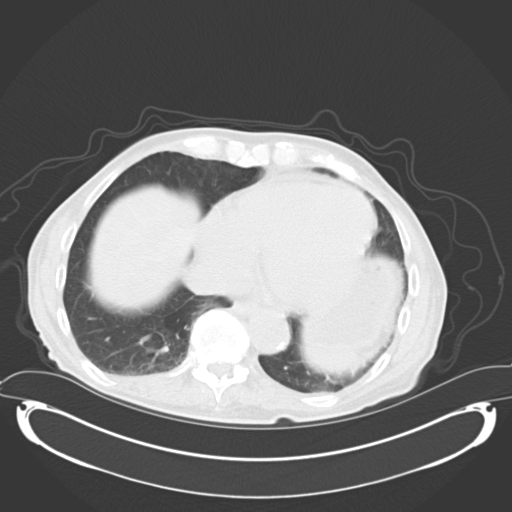
[im 27/70  mediastinal]
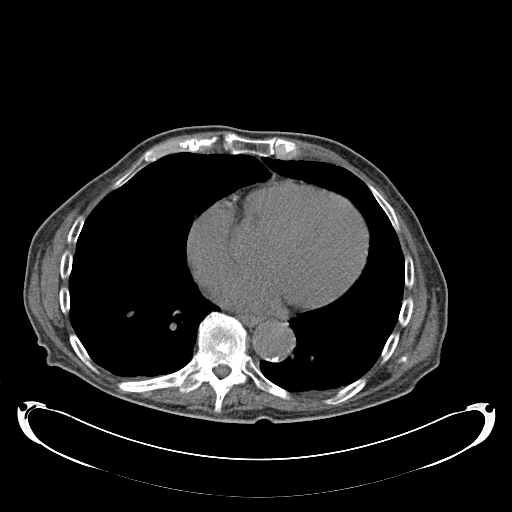
[im 27/70  lung]
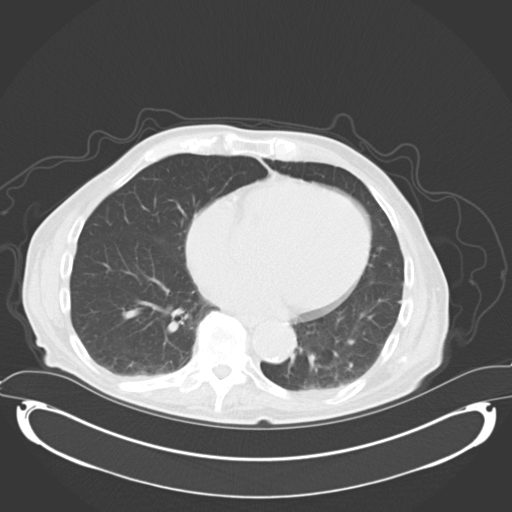
[im 32/70  lung]
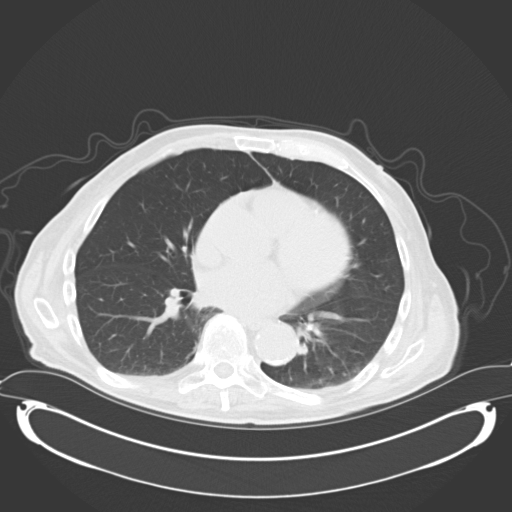
[im 38/70  lung]
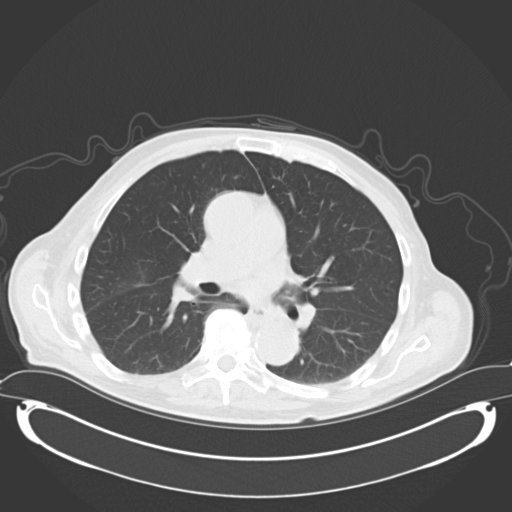
[im 43/70  lung]
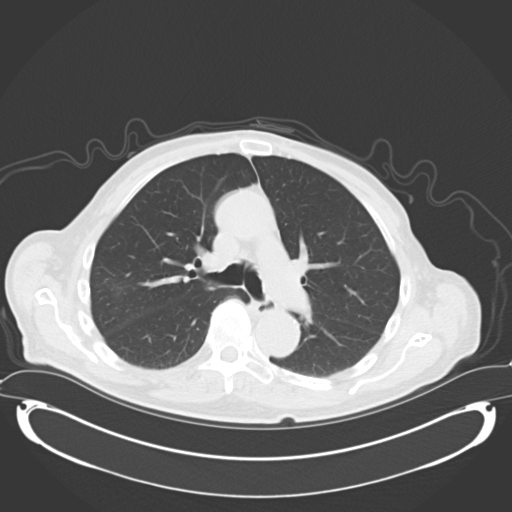
[im 48/70  mediastinal]
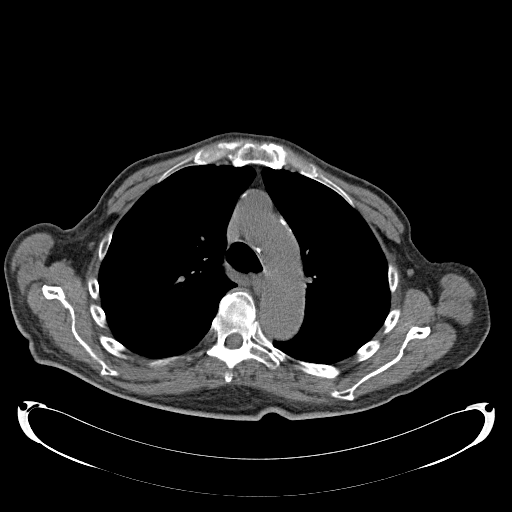
[im 48/70  lung]
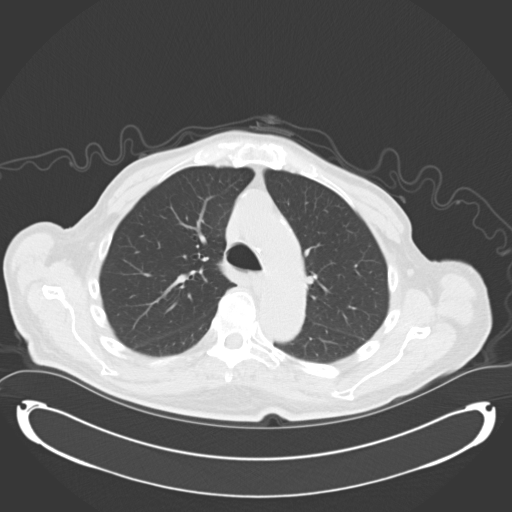
[im 54/70  lung]
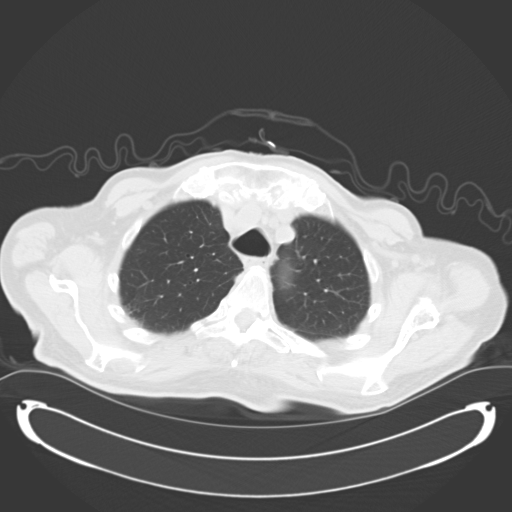
[im 59/70  lung]
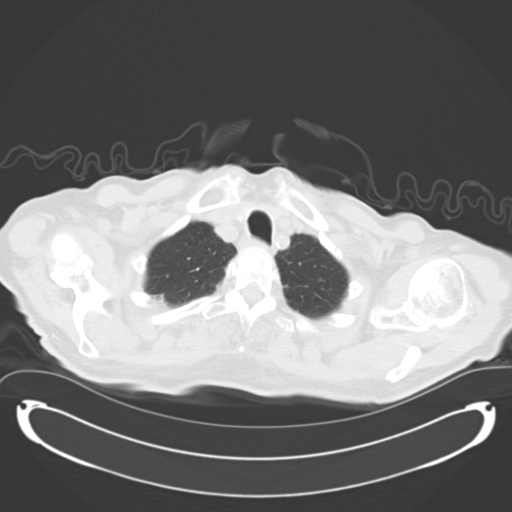
[im 64/70  lung]
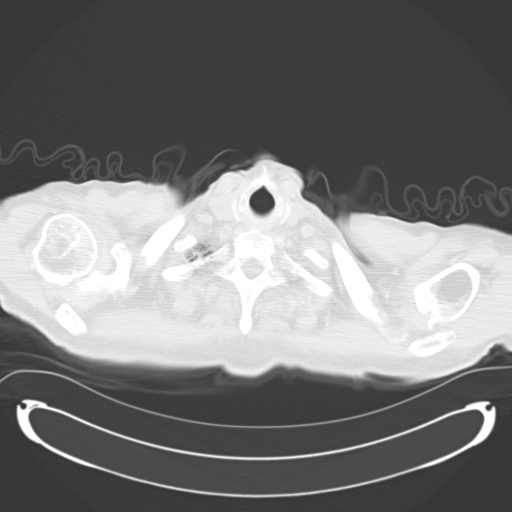

[Series 602: cor · coronal · 0.83mm/px · 3 of 118 slices shown]
[im 24/118  lung]
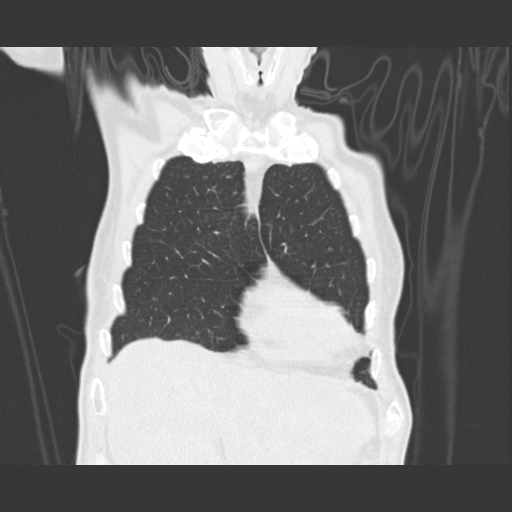
[im 47/118  lung]
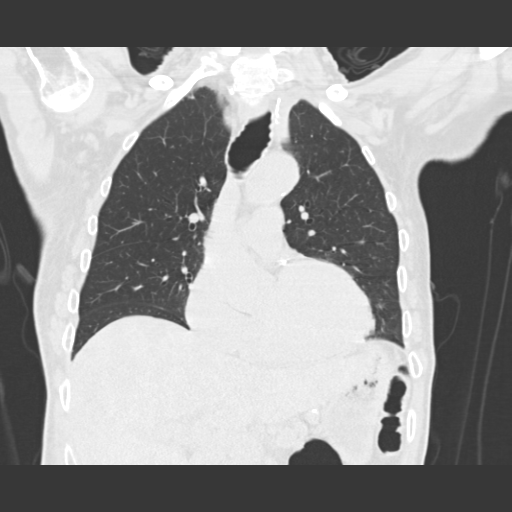
[im 71/118  lung]
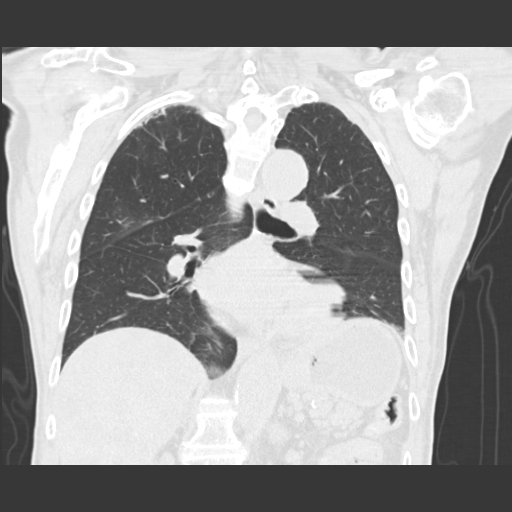

[15 of 36 positions shown; findings below may reference images not displayed]

FINDINGS: No suspicious pulmonary nodules. Very mild ground-glass opacity in
the posterior right upper lobe (series 5/ image 31), likely
infectious/inflammatory. Mild dependent atelectasis in the lower
lobes with associated trace bilateral pleural effusions. Biapical
pleural parenchymal scarring. No pneumothorax.

Visualized thyroid is unremarkable.

Cardiomegaly. No pericardial effusion. Coronary atherosclerosis.
Atherosclerotic calcifications of the aortic arch.

No suspicious mediastinal, hilar, or axillary lymphadenopathy.

Visualized upper abdomen is notable for extensive hepatic
metastases, better visualized on recent CT abdomen/pelvis.

Mild degenerative changes of the visualized thoracolumbar spine.
IMPRESSION: No findings suspicious for primary malignancy in the chest.

Very mild ground-glass opacity in the posterior right upper lobe,
likely infectious/inflammatory.

Extensive hepatic metastases, better visualized on recent CT
abdomen/pelvis.
